# Patient Record
Sex: Male | Born: 1945 | Race: White | Hispanic: No | Marital: Single | Smoking: Former smoker
Health system: Southern US, Community
[De-identification: ages and names within clinical notes are randomized; demographics above are authoritative.]

## PROBLEM LIST (undated history)

## (undated) DIAGNOSIS — E7800 Pure hypercholesterolemia, unspecified: Secondary | ICD-10-CM

## (undated) DIAGNOSIS — I10 Essential (primary) hypertension: Secondary | ICD-10-CM

## (undated) DIAGNOSIS — F419 Anxiety disorder, unspecified: Secondary | ICD-10-CM

## (undated) HISTORY — PX: EYE SURGERY: SHX253

## (undated) HISTORY — PX: HERNIA REPAIR: SHX51

## (undated) HISTORY — PX: HAND SURGERY: SHX662

---

## 2015-05-19 ENCOUNTER — Other Ambulatory Visit: Payer: Self-pay | Admitting: Acute Care

## 2015-05-19 DIAGNOSIS — Z87891 Personal history of nicotine dependence: Secondary | ICD-10-CM

## 2015-05-23 ENCOUNTER — Ambulatory Visit
Admission: RE | Admit: 2015-05-23 | Discharge: 2015-05-23 | Disposition: A | Discharge status: Home / Self Care | Payer: BC Managed Care – PPO | Source: Ambulatory Visit | Attending: Acute Care | Admitting: Acute Care

## 2015-05-23 DIAGNOSIS — Z87891 Personal history of nicotine dependence: Secondary | ICD-10-CM

## 2015-09-24 ENCOUNTER — Other Ambulatory Visit: Payer: Self-pay | Admitting: Acute Care

## 2015-09-24 DIAGNOSIS — Z87891 Personal history of nicotine dependence: Secondary | ICD-10-CM

## 2016-05-11 ENCOUNTER — Other Ambulatory Visit: Payer: Self-pay | Admitting: Orthopedic Surgery

## 2016-05-11 ENCOUNTER — Telehealth: Payer: Self-pay | Admitting: Acute Care

## 2016-05-11 NOTE — Telephone Encounter (Signed)
I had called the patient to schedule the Lung Cancer CT he returned my call today and stated that since the CT scan was normal last 05/23/15 he couldn't afford to continue doing the CT's for the next 7 years. His insurance keeps changing and he just had to cancel a colonoscopy that would have been [phone]. I told him I would put a note in his chart to that effect.

## 2016-06-10 ENCOUNTER — Encounter (HOSPITAL_BASED_OUTPATIENT_CLINIC_OR_DEPARTMENT_OTHER): Payer: Self-pay | Admitting: *Deleted

## 2016-06-14 ENCOUNTER — Encounter (HOSPITAL_BASED_OUTPATIENT_CLINIC_OR_DEPARTMENT_OTHER)
Admission: RE | Admit: 2016-06-14 | Discharge: 2016-06-14 | Disposition: A | Discharge status: Home / Self Care | Payer: BC Managed Care – PPO | Source: Ambulatory Visit | Attending: Orthopedic Surgery | Admitting: Orthopedic Surgery

## 2016-06-14 DIAGNOSIS — I10 Essential (primary) hypertension: Secondary | ICD-10-CM | POA: Diagnosis not present

## 2016-06-14 DIAGNOSIS — Z8612 Personal history of poliomyelitis: Secondary | ICD-10-CM | POA: Diagnosis not present

## 2016-06-14 DIAGNOSIS — M720 Palmar fascial fibromatosis [Dupuytren]: Secondary | ICD-10-CM | POA: Diagnosis not present

## 2016-06-14 DIAGNOSIS — M20091 Other deformity of right finger(s): Secondary | ICD-10-CM | POA: Diagnosis present

## 2016-06-14 DIAGNOSIS — Z79899 Other long term (current) drug therapy: Secondary | ICD-10-CM | POA: Diagnosis not present

## 2016-06-14 DIAGNOSIS — F419 Anxiety disorder, unspecified: Secondary | ICD-10-CM | POA: Diagnosis not present

## 2016-06-14 DIAGNOSIS — E785 Hyperlipidemia, unspecified: Secondary | ICD-10-CM | POA: Diagnosis not present

## 2016-06-14 NOTE — Progress Notes (Signed)
Spoke with Dr Randa EvensEdwards concerning ekg  State ok will eval in am

## 2016-06-15 ENCOUNTER — Ambulatory Visit (HOSPITAL_BASED_OUTPATIENT_CLINIC_OR_DEPARTMENT_OTHER)
Admission: RE | Admit: 2016-06-15 | Discharge: 2016-06-15 | Disposition: A | Discharge status: Home / Self Care | Payer: BC Managed Care – PPO | Source: Ambulatory Visit | Attending: Orthopedic Surgery | Admitting: Orthopedic Surgery

## 2016-06-15 ENCOUNTER — Ambulatory Visit (HOSPITAL_BASED_OUTPATIENT_CLINIC_OR_DEPARTMENT_OTHER): Payer: BC Managed Care – PPO | Admitting: Anesthesiology

## 2016-06-15 ENCOUNTER — Encounter (HOSPITAL_BASED_OUTPATIENT_CLINIC_OR_DEPARTMENT_OTHER): Payer: Self-pay | Admitting: Anesthesiology

## 2016-06-15 ENCOUNTER — Encounter (HOSPITAL_BASED_OUTPATIENT_CLINIC_OR_DEPARTMENT_OTHER)
Admission: RE | Disposition: A | Discharge status: Home / Self Care | Payer: Self-pay | Source: Ambulatory Visit | Attending: Orthopedic Surgery

## 2016-06-15 DIAGNOSIS — F419 Anxiety disorder, unspecified: Secondary | ICD-10-CM | POA: Insufficient documentation

## 2016-06-15 DIAGNOSIS — M720 Palmar fascial fibromatosis [Dupuytren]: Secondary | ICD-10-CM | POA: Insufficient documentation

## 2016-06-15 DIAGNOSIS — Z79899 Other long term (current) drug therapy: Secondary | ICD-10-CM | POA: Insufficient documentation

## 2016-06-15 DIAGNOSIS — E785 Hyperlipidemia, unspecified: Secondary | ICD-10-CM | POA: Insufficient documentation

## 2016-06-15 DIAGNOSIS — I10 Essential (primary) hypertension: Secondary | ICD-10-CM | POA: Insufficient documentation

## 2016-06-15 DIAGNOSIS — Z8612 Personal history of poliomyelitis: Secondary | ICD-10-CM | POA: Insufficient documentation

## 2016-06-15 HISTORY — DX: Anxiety disorder, unspecified: F41.9

## 2016-06-15 HISTORY — PX: DUPUYTREN CONTRACTURE RELEASE: SHX1478

## 2016-06-15 HISTORY — PX: FASCIECTOMY: SHX6525

## 2016-06-15 HISTORY — DX: Essential (primary) hypertension: I10

## 2016-06-15 SURGERY — RELEASE, DUPUYTREN CONTRACTURE
Anesthesia: General | Site: Hand | Laterality: Right

## 2016-06-15 MED ORDER — HEPARIN SODIUM (PORCINE) 1000 UNIT/ML IJ SOLN
INTRAMUSCULAR | Status: DC | PRN
  Administered 2016-06-15: 1 mL via INTRAVENOUS

## 2016-06-15 MED ORDER — FENTANYL CITRATE (PF) 100 MCG/2ML IJ SOLN
25.0000 ug | INTRAMUSCULAR | Status: DC | PRN
  Administered 2016-06-15: 25 ug via INTRAVENOUS

## 2016-06-15 MED ORDER — PROPOFOL 10 MG/ML IV BOLUS
INTRAVENOUS | Status: DC | PRN
  Administered 2016-06-15: 40 mg via INTRAVENOUS
  Administered 2016-06-15: 160 mg via INTRAVENOUS

## 2016-06-15 MED ORDER — HEPARIN SODIUM (PORCINE) 1000 UNIT/ML IJ SOLN
INTRAMUSCULAR | Status: AC
  Filled 2016-06-15: qty 1

## 2016-06-15 MED ORDER — BUPIVACAINE-EPINEPHRINE (PF) 0.5% -1:200000 IJ SOLN
INTRAMUSCULAR | Status: DC | PRN
  Administered 2016-06-15: 30 mL via PERINEURAL

## 2016-06-15 MED ORDER — ONDANSETRON HCL 4 MG/2ML IJ SOLN: 4.0000 mg | Freq: Once | INTRAMUSCULAR | Status: DC | PRN

## 2016-06-15 MED ORDER — FENTANYL CITRATE (PF) 100 MCG/2ML IJ SOLN
50.0000 ug | INTRAMUSCULAR | Status: AC | PRN
  Administered 2016-06-15: 100 ug via INTRAVENOUS
  Administered 2016-06-15 (×2): 50 ug via INTRAVENOUS

## 2016-06-15 MED ORDER — PROPOFOL 10 MG/ML IV BOLUS
INTRAVENOUS | Status: AC
  Filled 2016-06-15: qty 20

## 2016-06-15 MED ORDER — MIDAZOLAM HCL 2 MG/2ML IJ SOLN
INTRAMUSCULAR | Status: AC
  Filled 2016-06-15: qty 2

## 2016-06-15 MED ORDER — SCOPOLAMINE 1 MG/3DAYS TD PT72: 1.0000 | MEDICATED_PATCH | Freq: Once | TRANSDERMAL | Status: DC | PRN

## 2016-06-15 MED ORDER — MIDAZOLAM HCL 2 MG/2ML IJ SOLN
1.0000 mg | INTRAMUSCULAR | Status: DC | PRN
  Administered 2016-06-15: 2 mg via INTRAVENOUS

## 2016-06-15 MED ORDER — LACTATED RINGERS IV SOLN
INTRAVENOUS | Status: DC
  Administered 2016-06-15 (×2): via INTRAVENOUS

## 2016-06-15 MED ORDER — PHENYLEPHRINE HCL 10 MG/ML IJ SOLN
INTRAMUSCULAR | Status: DC | PRN
  Administered 2016-06-15 (×4): 80 ug via INTRAVENOUS

## 2016-06-15 MED ORDER — FENTANYL CITRATE (PF) 100 MCG/2ML IJ SOLN
INTRAMUSCULAR | Status: AC
  Filled 2016-06-15: qty 2

## 2016-06-15 MED ORDER — THROMBIN 5000 UNITS EX SOLR
CUTANEOUS | Status: DC | PRN
  Administered 2016-06-15: 5000 [IU] via TOPICAL

## 2016-06-15 MED ORDER — CEFAZOLIN SODIUM-DEXTROSE 2-4 GM/100ML-% IV SOLN
2.0000 g | INTRAVENOUS | Status: AC
  Administered 2016-06-15: 2 g via INTRAVENOUS

## 2016-06-15 MED ORDER — HYDROCODONE-ACETAMINOPHEN 5-325 MG PO TABS: 1.0000 | ORAL_TABLET | Freq: Four times a day (QID) | ORAL | 0 refills | Status: DC | PRN

## 2016-06-15 MED ORDER — LIDOCAINE HCL (CARDIAC) 20 MG/ML IV SOLN
INTRAVENOUS | Status: DC | PRN
  Administered 2016-06-15: 60 mg via INTRAVENOUS

## 2016-06-15 MED ORDER — THROMBIN 5000 UNITS EX SOLR
CUTANEOUS | Status: AC
  Filled 2016-06-15: qty 5000

## 2016-06-15 MED ORDER — EPHEDRINE SULFATE 50 MG/ML IJ SOLN
INTRAMUSCULAR | Status: DC | PRN
  Administered 2016-06-15 (×4): 10 mg via INTRAVENOUS

## 2016-06-15 MED ORDER — CEFAZOLIN SODIUM-DEXTROSE 2-4 GM/100ML-% IV SOLN
INTRAVENOUS | Status: AC
  Filled 2016-06-15: qty 100

## 2016-06-15 MED ORDER — ONDANSETRON HCL 4 MG/2ML IJ SOLN
INTRAMUSCULAR | Status: DC | PRN
  Administered 2016-06-15: 4 mg via INTRAVENOUS

## 2016-06-15 MED ORDER — DEXAMETHASONE SODIUM PHOSPHATE 10 MG/ML IJ SOLN
INTRAMUSCULAR | Status: DC | PRN
  Administered 2016-06-15: 10 mg via INTRAVENOUS

## 2016-06-15 MED ORDER — CHLORHEXIDINE GLUCONATE 4 % EX LIQD: 60.0000 mL | Freq: Once | CUTANEOUS | Status: DC

## 2016-06-15 SURGICAL SUPPLY — 50 items
BLADE MINI RND TIP GREEN BEAV (BLADE) ×3 IMPLANT
BLADE SURG 15 STRL LF DISP TIS (BLADE) ×1 IMPLANT
BLADE SURG 15 STRL SS (BLADE) ×2
BNDG COHESIVE 3X5 TAN STRL LF (GAUZE/BANDAGES/DRESSINGS) ×3 IMPLANT
BNDG ESMARK 4X9 LF (GAUZE/BANDAGES/DRESSINGS) ×3 IMPLANT
BNDG GAUZE ELAST 4 BULKY (GAUZE/BANDAGES/DRESSINGS) ×6 IMPLANT
CHLORAPREP W/TINT 26ML (MISCELLANEOUS) ×3 IMPLANT
CORDS BIPOLAR (ELECTRODE) ×3 IMPLANT
COVER BACK TABLE 60X90IN (DRAPES) ×3 IMPLANT
COVER MAYO STAND STRL (DRAPES) ×3 IMPLANT
CUFF TOURNIQUET SINGLE 18IN (TOURNIQUET CUFF) ×3 IMPLANT
DECANTER SPIKE VIAL GLASS SM (MISCELLANEOUS) IMPLANT
DRAPE EXTREMITY T 121X128X90 (DRAPE) ×3 IMPLANT
DRAPE SURG 17X23 STRL (DRAPES) ×3 IMPLANT
GAUZE SPONGE 4X4 12PLY STRL (GAUZE/BANDAGES/DRESSINGS) ×6 IMPLANT
GAUZE XEROFORM 1X8 LF (GAUZE/BANDAGES/DRESSINGS) ×6 IMPLANT
GLOVE BIO SURGEON STRL SZ 6.5 (GLOVE) ×2 IMPLANT
GLOVE BIO SURGEONS STRL SZ 6.5 (GLOVE) ×1
GLOVE BIOGEL PI IND STRL 7.0 (GLOVE) ×2 IMPLANT
GLOVE BIOGEL PI IND STRL 8 (GLOVE) ×2 IMPLANT
GLOVE BIOGEL PI IND STRL 8.5 (GLOVE) ×1 IMPLANT
GLOVE BIOGEL PI INDICATOR 7.0 (GLOVE) ×4
GLOVE BIOGEL PI INDICATOR 8 (GLOVE) ×4
GLOVE BIOGEL PI INDICATOR 8.5 (GLOVE) ×2
GLOVE SURG ORTHO 8.0 STRL STRW (GLOVE) ×3 IMPLANT
GLOVE SURG SS PI 7.5 STRL IVOR (GLOVE) ×6 IMPLANT
GOWN STRL REUS W/ TWL LRG LVL3 (GOWN DISPOSABLE) ×3 IMPLANT
GOWN STRL REUS W/TWL LRG LVL3 (GOWN DISPOSABLE) ×6
GOWN STRL REUS W/TWL XL LVL3 (GOWN DISPOSABLE) ×3 IMPLANT
LOOP VESSEL MAXI BLUE (MISCELLANEOUS) ×3 IMPLANT
NEEDLE PRECISIONGLIDE 27X1.5 (NEEDLE) ×3 IMPLANT
NS IRRIG 1000ML POUR BTL (IV SOLUTION) ×3 IMPLANT
PACK BASIN DAY SURGERY FS (CUSTOM PROCEDURE TRAY) ×3 IMPLANT
PAD CAST 3X4 CTTN HI CHSV (CAST SUPPLIES) ×2 IMPLANT
PADDING CAST ABS 3INX4YD NS (CAST SUPPLIES)
PADDING CAST ABS 4INX4YD NS (CAST SUPPLIES)
PADDING CAST ABS COTTON 3X4 (CAST SUPPLIES) IMPLANT
PADDING CAST ABS COTTON 4X4 ST (CAST SUPPLIES) IMPLANT
PADDING CAST COTTON 3X4 STRL (CAST SUPPLIES) ×4
SLEEVE SCD COMPRESS KNEE MED (MISCELLANEOUS) ×3 IMPLANT
SLING ARM FOAM STRAP LRG (SOFTGOODS) ×3 IMPLANT
SPLINT PLASTER CAST XFAST 3X15 (CAST SUPPLIES) ×8 IMPLANT
SPLINT PLASTER XTRA FASTSET 3X (CAST SUPPLIES) ×16
STOCKINETTE 4X48 STRL (DRAPES) ×3 IMPLANT
SUT ETHILON 4 0 PS 2 18 (SUTURE) ×9 IMPLANT
SUT SILK 2 0 FS (SUTURE) ×3 IMPLANT
SYR BULB 3OZ (MISCELLANEOUS) ×3 IMPLANT
SYR CONTROL 10ML LL (SYRINGE) ×3 IMPLANT
TOWEL OR 17X24 6PK STRL BLUE (TOWEL DISPOSABLE) ×6 IMPLANT
UNDERPAD 30X30 (UNDERPADS AND DIAPERS) IMPLANT

## 2016-06-15 NOTE — Brief Op Note (Signed)
06/15/2016  1:07 PM  PATIENT:  Jason BrowGregory D Todd  70 y.o. male  PRE-OPERATIVE DIAGNOSIS:  dupuytren's contracture hand  POST-OPERATIVE DIAGNOSIS:  dupuytren's contracture right hand  PROCEDURE:  Procedure(s): Excision DUPUYTREN palmar fibromatosis (Right) FASCIECTOMY right small and ring finger (Right)  SURGEON:  Surgeon(s) and Role:    * Cindee SaltGary Molly Savarino, MD - Primary  PHYSICIAN ASSISTANT:   ASSISTANTS: none   ANESTHESIA:   regional and IV sedation  EBL:  Total I/O In: 1800 [I.V.:1800] Out: -   BLOOD ADMINISTERED:none  DRAINS: Penrose drain in the hand   LOCAL MEDICATIONS USED:  NONE  SPECIMEN:  Excision  DISPOSITION OF SPECIMEN:  PATHOLOGY  COUNTS:  YES  TOURNIQUET:   Total Tourniquet Time Documented: Upper Arm (Right) - 103 minutes Total: Upper Arm (Right) - 103 minutes   DICTATION: .Other Dictation: Dictation Number [id number]  PLAN OF CARE: Discharge to home after PACU  PATIENT DISPOSITION:  PACU - hemodynamically stable.

## 2016-06-15 NOTE — Transfer of Care (Signed)
Immediate Anesthesia Transfer of Care Note  Patient: Jason Todd  Procedure(s) Performed: Procedure(s): Excision DUPUYTREN palmar fibromatosis (Right) FASCIECTOMY right small and ring finger (Right)  Patient Location: PACU  Anesthesia Type:GA combined with regional for post-op pain  Level of Consciousness: awake, alert  and oriented  Airway & Oxygen Therapy: Patient Spontanous Breathing and Patient connected to face mask oxygen  Post-op Assessment: Report given to RN and Post -op Vital signs reviewed and stable  Post vital signs: Reviewed and stable  Last Vitals:  Vitals:   06/15/16 1309 06/15/16 1310  BP:  128/90  Pulse: (!) 109   Resp: 14 19  Temp:      Last Pain:  Vitals:   06/15/16 0920  TempSrc: Oral         Complications: No apparent anesthesia complications

## 2016-06-15 NOTE — Anesthesia Preprocedure Evaluation (Addendum)
Anesthesia Evaluation  Patient identified by MRN, date of birth, ID band Patient awake    Reviewed: Allergy & Precautions, NPO status , Patient's Chart, lab work & pertinent test results  Airway Mallampati: II  TM Distance: >3 FB Neck ROM: Full    Dental no notable dental hx. (+) Teeth Intact   Pulmonary neg pulmonary ROS,    Pulmonary exam normal breath sounds clear to auscultation       Cardiovascular hypertension, Pt. on medications Normal cardiovascular exam+ dysrhythmias  Rhythm:Regular Rate:Normal     Neuro/Psych Anxiety negative neurological ROS     GI/Hepatic negative GI ROS, Neg liver ROS,   Endo/Other  Hyperlipidemia  Renal/GU negative Renal ROS  negative genitourinary   Musculoskeletal negative musculoskeletal ROS (+)   Abdominal   Peds  Hematology negative hematology ROS (+)   Anesthesia Other Findings   Reproductive/Obstetrics                          EKG: sinus tachycardia, Incomplete RBBB pattern.  Anesthesia Physical Anesthesia Plan  ASA: II  Anesthesia Plan: General and Regional   Post-op Pain Management:  Regional for Post-op pain   Induction: Intravenous  Airway Management Planned: LMA  Additional Equipment:   Intra-op Plan:   Post-operative Plan: Extubation in OR  Informed Consent: I have reviewed the patients History and Physical, chart, labs and discussed the procedure including the risks, benefits and alternatives for the proposed anesthesia with the patient or authorized representative who has indicated his/her understanding and acceptance.   Dental advisory given  Plan Discussed with: Anesthesiologist, CRNA and Surgeon  Anesthesia Plan Comments:       Anesthesia Quick Evaluation

## 2016-06-15 NOTE — H&P (Signed)
  Jason BrowGregory D Todd is an 70 y.o. male.   Chief Complaint: contracture right ring and small fingers HPI: Jason GottronGregory Todd is a 70 year old right hand dominant male who was last seen in 2011. He has a history of Polio and history of Dupuytren's contracture. He has seen Dr. Bronson Ingichard Goldner who did an aponeurotomy of his right ring and small fingers in 2014 but this has recurred. He has a history of carpal tunnel syndrome on that side. He has a history of injury to his ring finger which he feels aggravated the Dupuytren's resulting in the aponeurotomy. The contractures have recurred. He has a history of Dupuytren's. He does not have Peyronies. He is otherwise in reasonably good health. He is not complaining of any pain or discomfort at the present time.      Past Medical History:  Diagnosis Date  . Anxiety   . Hypertension     Past Surgical History:  Procedure Laterality Date  . EYE SURGERY    . HAND SURGERY     x 2  . HERNIA REPAIR      History reviewed. No pertinent family history. Social History:  reports that he has never smoked. He has never used smokeless tobacco. He reports that he drinks alcohol. He reports that he does not use drugs.  Allergies:  Allergies  Allergen Reactions  . Lipitor [Atorvastatin] Other (See Comments)    Muscle ache  . Sulfa Antibiotics     unsure    Medications Prior to Admission  Medication Sig Dispense Refill  . cholecalciferol (VITAMIN D) 1000 units tablet Take 1,000 Units by mouth daily.    . clonazePAM (KLONOPIN) 0.5 MG tablet Take 0.5 mg by mouth 2 (two) times daily as needed for anxiety.    Marland Kitchen. lisinopril (PRINIVIL,ZESTRIL) 10 MG tablet Take 10 mg by mouth daily.    . Multiple Vitamin (MULTIVITAMIN WITH MINERALS) TABS tablet Take 1 tablet by mouth daily.    . simvastatin (ZOCOR) 20 MG tablet Take 20 mg by mouth daily.    . valACYclovir (VALTREX) 1000 MG tablet Take 1,000 mg by mouth 2 (two) times daily.      No results found for this or any  previous visit (from the past 48 hour(s)).  No results found.   Pertinent items are noted in HPI.  Height 5\' 1"  (1.549 m), weight 65.8 kg (145 lb).  General appearance: alert, cooperative and appears stated age Head: Normocephalic, without obvious abnormality Neck: no JVD Resp: clear to auscultation bilaterally Cardio: regular rate and rhythm, S1, S2 normal, no murmur, click, rub or gallop GI: soft, non-tender; bowel sounds normal; no masses,  no organomegaly Extremities: contracture right ringand small fingers Pulses: 2+ and symmetric Skin: Skin color, texture, turgor normal. No rashes or lesions Neurologic: Grossly normal Incision/Wound: na  Assessment/Plan  DIAGNOSIS: Dupuyten's contracture s/p aponeurotomy     Plan: We have discussed the etiology of the Dupuytren's with him. Various treatment alternatives including aponeurotomy Xiaflex fasciotomy fasciectomy along with risks and complications of each. Pre, peri and Postoperative course been discussed along with risks and complications. He is aware that there is no guarantee to the surgery the possibility of infection recurrence injury to arteries nerves tendons incomplete relief of symptoms and dystrophy. He would like to proceed he is scheduled for fasciectomy ring and small fingers right hand as an outpatient under regional anesthesia.      Samanatha Brammer R 06/15/2016, 9:04 AM

## 2016-06-15 NOTE — Op Note (Signed)
Dictation Number [id number]

## 2016-06-15 NOTE — Anesthesia Procedure Notes (Signed)
Anesthesia Regional Block:  Supraclavicular block  Pre-Anesthetic Checklist: ,, timeout performed, Correct Patient, Correct Site, Correct Laterality, Correct Procedure, Correct Position, site marked, Risks and benefits discussed,  Surgical consent,  Pre-op evaluation,  At surgeon's request and post-op pain management  Laterality: Right  Prep: chloraprep       Needles:  Injection technique: Single-shot  Needle Type: Stimulator Needle - 80          Additional Needles:  Procedures: ultrasound guided (picture in chart) Supraclavicular block Narrative:  Start time: 06/15/2016 10:22 AM End time: 06/15/2016 10:26 AM Injection made incrementally with aspirations every 5 mL.  Performed by: Personally  Anesthesiologist: Lannis Lichtenwalner  Additional Notes: 30 cc 0.5% Bupivacaine with 1:200 Epi injected easily

## 2016-06-15 NOTE — Progress Notes (Signed)
Assisted Dr. Joslin with right, ultrasound guided, supraclavicular block. Side rails up, monitors on throughout procedure. See vital signs in flow sheet. Tolerated Procedure well. 

## 2016-06-15 NOTE — Discharge Instructions (Addendum)
[facility] Instructions Hand Surgery  Wound Care: Keep your hand elevated above the level of your heart.  Do not allow it to dangle by your side.  Keep the dressing dry and do not remove it unless your doctor advises you to do so.  He will usually change it at the time of your post-op visit.  Moving your fingers is advised to stimulate circulation but will depend on the site of your surgery.  If you have a splint applied, your doctor will advise you regarding movement.  Activity: Do not drive or operate machinery today.  Rest today and then you may return to your normal activity and work as indicated by your physician.  Diet:  Drink liquids today or eat a light diet.  You may resume a regular diet tomorrow.    General expectations: Pain for two to three days. Fingers may become slightly swollen.  Call your doctor if any of the following occur: Severe pain not relieved by pain medication. Elevated temperature. Dressing soaked with blood. Inability to move fingers. White or bluish color to fingers.  One baby ASA a day   Post Anesthesia Home Care Instructions  Activity: Get plenty of rest for the remainder of the day. A responsible adult should stay with you for 24 hours following the procedure.  For the next 24 hours, DO NOT: -Drive a car -Advertising copywriterperate machinery -Drink alcoholic beverages -Take any medication unless instructed by your physician -Make any legal decisions or sign important papers.  Meals: Start with liquid foods such as gelatin or soup. Progress to regular foods as tolerated. Avoid greasy, spicy, heavy foods. If nausea and/or vomiting occur, drink only clear liquids until the nausea and/or vomiting subsides. Call your physician if vomiting continues.  Special Instructions/Symptoms: Your throat may feel dry or sore from the anesthesia or the breathing tube placed in your throat during surgery. If this causes discomfort, gargle with warm salt water. The discomfort  should disappear within 24 hours.  If you had a scopolamine patch placed behind your ear for the management of post- operative nausea and/or vomiting:  1. The medication in the patch is effective for 72 hours, after which it should be removed.  Wrap patch in a tissue and discard in the trash. Wash hands thoroughly with soap and water. 2. You may remove the patch earlier than 72 hours if you experience unpleasant side effects which may include dry mouth, dizziness or visual disturbances. 3. Avoid touching the patch. Wash your hands with soap and water after contact with the patch.

## 2016-06-15 NOTE — Anesthesia Procedure Notes (Signed)
Procedure Name: LMA Insertion Date/Time: 06/15/2016 10:30 AM Performed by: Burna CashONRAD, Keshana Klemz C Pre-anesthesia Checklist: Patient identified, Emergency Drugs available, Suction available and Patient being monitored Patient Re-evaluated:Patient Re-evaluated prior to inductionOxygen Delivery Method: Circle system utilized Preoxygenation: Pre-oxygenation with 100% oxygen Intubation Type: IV induction Ventilation: Mask ventilation without difficulty LMA: LMA inserted LMA Size: 4.0 Number of attempts: 1 Airway Equipment and Method: Bite block Placement Confirmation: positive ETCO2 Tube secured with: Tape Dental Injury: Teeth and Oropharynx as per pre-operative assessment

## 2016-06-15 NOTE — Anesthesia Postprocedure Evaluation (Signed)
Anesthesia Post Note  Patient: Jason Todd  Procedure(s) Performed: Procedure(s) (LRB): Excision DUPUYTREN palmar fibromatosis (Right) FASCIECTOMY right small and ring finger (Right)  Patient location during evaluation: PACU Anesthesia Type: General and Regional Level of consciousness: awake and alert and oriented Pain management: pain level controlled Vital Signs Assessment: post-procedure vital signs reviewed and stable Respiratory status: spontaneous breathing, nonlabored ventilation and respiratory function stable Cardiovascular status: blood pressure returned to baseline and stable Postop Assessment: no signs of nausea or vomiting Anesthetic complications: no    Last Vitals:  Vitals:   06/15/16 1345 06/15/16 1346  BP: (!) 146/128 (!) 140/103  Pulse: (!) 122 (!) 120  Resp: 18 19  Temp:      Last Pain:  Vitals:   06/15/16 1345  TempSrc:   PainSc: 4                  Hannibal Skalla A.

## 2016-06-16 ENCOUNTER — Encounter (HOSPITAL_BASED_OUTPATIENT_CLINIC_OR_DEPARTMENT_OTHER): Payer: Self-pay | Admitting: Orthopedic Surgery

## 2016-06-16 NOTE — Op Note (Signed)
NAMIsidor Holts:  Jason Todd, Jason Todd             ACCOUNT NO.:  [id number]  MEDICAL RECORD NO.:  [id number]  LOCATION:                                 FACILITY:  PHYSICIAN:  Cindee SaltGary Arletta Lumadue, M.D.            DATE OF BIRTH:  DATE OF PROCEDURE:  06/15/2016 DATE OF DISCHARGE:                              OPERATIVE REPORT   PREOPERATIVE DIAGNOSIS:  Dupuytren contracture, right ring, right small fingers.  POSTOPERATIVE DIAGNOSIS:  Dupuytren contracture, right ring, right small fingers.  OPERATION:  Fasciectomy right ring and right small fingers.  SURGEON:  Cindee SaltGary Neave Lenger, M.D.  ASSISTANT:  None.  ANESTHESIA:  Regional with sedation.  HISTORY:  The patient is a 70 year old male with a history of Dupuytren contracture to the right ring and small fingers.  These show significant deformities to both the MP and PIP joints of approximately 30-40 degrees at each joint.  This is relatively fixed in nature.  He has undergone an aponeurotomy in the past to each of these digits with recurrence.  He is desirous of proceeding to have excisions performed.  He is aware that there is no guarantee to the surgery, the possibility of infection; recurrence of injury to arteries, nerves, tendons; incomplete relief of symptoms; dystrophy; possibility of loss of the finger; the possibility of stiffness.  In the preoperative area, the patient was seen, the extremity marked by both patient and surgeon.  Antibiotic was given.  PROCEDURE IN DETAIL:  The patient was brought to the operating room after a regional anesthetic was carried out by the Anesthesia department in the preoperative area.  Further anesthetic was enjoined via LMA.  He was prepped using ChloraPrep in a supine position with the right arm free.  A 3-minute dry time was allowed and time-out taken, confirming the patient and procedure.  The limb was exsanguinated with an Esmarch bandage.  Tourniquet placed high on the arm was inflated to 250 mmHg.  A volar Bruner  incision was made for placement of V-Y advancement as necessary dressing the ring finger first.  The incision was carried from the proximal aspect of the cord at its junction with the flexor retinaculum.  This was carried down through subcutaneous tissue.  The cord was immediately identified.  There was significant scar present along the entire cord throughout the entire procedure on both fingers. The cord was identified proximally.  The neurovascular bundles were identified proximally.  With blunt and sharp dissection, the cord was followed distally taking care to protect the neurovascular bundles both radially and ulnarly to the ring and ulnarly to the small finger.  This was carried distally with a large attachment at the A2 pulley, this was released.  The dissection was carried distally.  There was a spiral band and spiral nerve present on the ulnar aspect.  The neurovascular bundle was carefully protected throughout the procedure.  A very thick lateral digital sheet was present on either side along with involvement of neurovascular bundles both radially and ulnarly.  The cords were followed distally with an attachment at the PIP joint and further attachment out to the distal interphalangeal joint.  Care was maintained protecting neurovascular bundles radially  and ulnarly.  The lateral digital sheath cords were markedly thickened and each removed.  These were transected distally taking care to protect both artery and nerve distally.  This allowed the finger to come fully straight at both the MP and the PIP joint.  The small finger was attended to next.  A volar Bruner incision was made.  The connection over to the palmar fascia cord which had been transected to the ring finger was identified beneath the skin.  This was delivered distally in the incision and followed distally.  A large abductor digiti quinti cord was present.  The neurovascular bundle ulnarly was wrapped around this and  then underneath it.  The digital nerve from the radial side was identified and protected.  There was a very small radial artery present.  The dissection was carried distally.  After releasing the abductor digiti quinti, the metacarpophalangeal joint came fully straight.  The cord was followed distally both radially and ulnarly as lateral digital sheath cords which were significantly thickened.  These were followed attachment of the PIP joint and then distally out to the distal margin of the middle phalanx.  Each cord was followed distally.  There was a spiral nature to the one on the radial side and the nerve and artery were protected.  The cord was entirely excised both radially and ulnarly.  Again, the PIP joint on the small finger came fully straight. No release of the flexor sheath was necessary.  The wounds were copiously irrigated with saline and bathed in thrombin.  These were converted to Wise for advancement of the skin.  The skin was then closed over a doubled over vessel loop as a drain at each digit.  The wounds were fully closed.  A compressive dressing was applied and the tourniquet deflated.  The small finger immediately pinked.  The ring finger did not immediately pinked.  1000 units of heparin were given. His blood pressure was low, this was brought up and with time the ring finger fully pinked including the flaps after having the entire dressing removed to visualize the entire finger.  A nonadherent gauze and compressive wrap were applied along with a dorsal splint.  The patient tolerated the procedure well and was taken to the recovery room for observation in satisfactory condition.  He will be discharged to home [facility] [place] [facility] [place] on [place].          ______________________________ Cindee SaltGary Talis Jason Todd, M.D.     GK/MEDQ  D:  06/15/2016  T:  06/16/2016  Job:  [id number]

## 2017-07-13 ENCOUNTER — Telehealth: Payer: Self-pay | Admitting: Acute Care

## 2017-07-13 DIAGNOSIS — Z122 Encounter for screening for malignant neoplasm of respiratory organs: Secondary | ICD-10-CM

## 2017-07-13 DIAGNOSIS — Z87891 Personal history of nicotine dependence: Secondary | ICD-10-CM

## 2017-07-14 NOTE — Telephone Encounter (Signed)
Spoke with pt.  He has spoken with Dr Docia ChuckKoirala and has decided to participate in lung screening again.  CT ordered through [facility]. Marland Kitchen.  [facility]

## 2017-07-28 ENCOUNTER — Telehealth: Payer: Self-pay | Admitting: Acute Care

## 2017-07-28 NOTE — Telephone Encounter (Signed)
LMTC x 1 - Need to reschedule SDMV to possibly 08/08/17 2:00 with [facility]

## 2017-08-01 ENCOUNTER — Telehealth: Payer: Self-pay | Admitting: Acute Care

## 2017-08-01 ENCOUNTER — Encounter: Payer: BC Managed Care – PPO | Admitting: Acute Care

## 2017-08-01 NOTE — Telephone Encounter (Signed)
Spoke with pt and rescheduled SDMV ot 08/08/17 2:00.  Ct will be rescheduled. Nothing further needed

## 2017-08-02 ENCOUNTER — Ambulatory Visit: Payer: BC Managed Care – PPO

## 2017-08-02 NOTE — Telephone Encounter (Signed)
Error, close encounter

## 2017-08-08 ENCOUNTER — Ambulatory Visit
Admission: RE | Admit: 2017-08-08 | Discharge: 2017-08-08 | Disposition: A | Discharge status: Home / Self Care | Payer: BC Managed Care – PPO | Source: Ambulatory Visit | Attending: Acute Care | Admitting: Acute Care

## 2017-08-08 ENCOUNTER — Ambulatory Visit: Payer: BC Managed Care – PPO | Admitting: Acute Care

## 2017-08-08 ENCOUNTER — Encounter: Payer: Self-pay | Admitting: Acute Care

## 2017-08-08 DIAGNOSIS — Z87891 Personal history of nicotine dependence: Secondary | ICD-10-CM

## 2017-08-08 DIAGNOSIS — Z122 Encounter for screening for malignant neoplasm of respiratory organs: Secondary | ICD-10-CM

## 2017-08-08 NOTE — Progress Notes (Signed)
Shared Decision Making Visit Lung Cancer Screening Program [phone]([id number])   Eligibility:  Age 72 y.o.  Pack Years Smoking History Calculation 40 pack year smoking history (# packs/per year x # years smoked)  Recent History of coughing up blood  no  Unexplained weight loss? no ( >Than 15 pounds within the last 6 months )  Prior History Lung / other cancer no (Diagnosis within the last 5 years already requiring surveillance chest CT Scans).  Smoking Status Former Smoker  Former Smokers: Years since quit: 9 years  Quit Date: 03/2008  Visit Components:  Discussion included one or more decision making aids. yes  Discussion included risk/benefits of screening. yes  Discussion included potential follow up diagnostic testing for abnormal scans. yes  Discussion included meaning and risk of over diagnosis. yes  Discussion included meaning and risk of False Positives. yes  Discussion included meaning of total radiation exposure. yes  Counseling Included:  Importance of adherence to annual lung cancer LDCT screening. yes  Impact of comorbidities on ability to participate in the program. yes  Ability and willingness to under diagnostic treatment. yes  Smoking Cessation Counseling:  Current Smokers:   Discussed importance of smoking cessation. NA former smoker  Information about tobacco cessation classes and interventions provided to patient. Yes  Patient provided with "ticket" for LDCT Scan. yes  Symptomatic Patient. no  Counseling  Diagnosis Code: Tobacco Use Z72.0  Asymptomatic Patient yes  Counseling (Intermediate counseling: > three minutes counseling) [id number]  Former Smokers:   Discussed the importance of maintaining cigarette abstinence. yes  Diagnosis Code: Personal History of Nicotine Dependence. U98.119Z87.891  Information about tobacco cessation classes and interventions provided to patient. Yes  Patient provided with "ticket" for LDCT Scan. yes  Written Order  for Lung Cancer Screening with LDCT placed in Epic. Yes (CT Chest Lung Cancer Screening Low Dose W/O CM) [id number] Z12.2-Screening of respiratory organs Z87.891-Personal history of nicotine dependence  I spent 25 minutes of face to face time with Dr.m Noralyn Pickarroll discussing the risks and benefits of lung cancer screening. We viewed a power point together that explained in detail the above noted topics. We took the time to pause the power point at intervals to allow for questions to be asked and answered to ensure understanding. We discussed that he had taken the single most powerful action possible to decrease his risk of developing lung cancer when he quit smoking. I counseled him to remain smoke free, and to contact me if he ever had the desire to smoke again so that I can provide resources and tools to help support the effort to remain smoke free. We discussed the time and location of the scan, and that either  Abigail Miyamotoenise Phelps RN or I will call with the results within  24-48 hours of receiving them. Dr. Noralyn Pickarroll has my card and contact information in the event he needs to speak with me, in addition to a copy of the power point we reviewed as a resource. He  verbalized understanding of all of the above and had no further questions upon leaving the office.     I explained to the patient that there has been a high incidence of coronary artery disease noted on these exams. I explained that this is a non-gated exam therefore degree or severity cannot be determined. This patient is currently on statin therapy. I have asked the patient to follow-up with their PCP regarding any incidental finding of coronary artery disease and management with diet or medication as  they feel is clinically indicated. The patient verbalized understanding of the above and had no further questions.     Bevelyn Ngo, NP 08/08/2017

## 2017-08-15 ENCOUNTER — Other Ambulatory Visit: Payer: Self-pay | Admitting: Acute Care

## 2017-08-15 DIAGNOSIS — Z122 Encounter for screening for malignant neoplasm of respiratory organs: Secondary | ICD-10-CM

## 2017-08-15 DIAGNOSIS — Z87891 Personal history of nicotine dependence: Secondary | ICD-10-CM

## 2018-08-09 ENCOUNTER — Ambulatory Visit
Admission: RE | Admit: 2018-08-09 | Discharge: 2018-08-09 | Disposition: A | Discharge status: Home / Self Care | Payer: BC Managed Care – PPO | Source: Ambulatory Visit | Attending: Acute Care | Admitting: Acute Care

## 2018-08-09 DIAGNOSIS — Z87891 Personal history of nicotine dependence: Secondary | ICD-10-CM

## 2018-08-09 DIAGNOSIS — Z122 Encounter for screening for malignant neoplasm of respiratory organs: Secondary | ICD-10-CM

## 2018-08-15 ENCOUNTER — Other Ambulatory Visit: Payer: Self-pay | Admitting: Acute Care

## 2018-08-15 DIAGNOSIS — Z87891 Personal history of nicotine dependence: Secondary | ICD-10-CM

## 2018-08-15 DIAGNOSIS — Z122 Encounter for screening for malignant neoplasm of respiratory organs: Secondary | ICD-10-CM

## 2019-08-13 ENCOUNTER — Telehealth: Payer: Self-pay | Admitting: Acute Care

## 2019-08-13 NOTE — Telephone Encounter (Signed)
I have called and had to leave a message to have Jason Todd to call me back in the morning

## 2019-08-14 NOTE — Telephone Encounter (Signed)
Jason Todd didn't realize that I did have the new [employer] information for the patient. I told her I had called and Prior Auth was not required Ref # [id number]

## 2019-08-17 ENCOUNTER — Ambulatory Visit
Admission: RE | Admit: 2019-08-17 | Discharge: 2019-08-17 | Disposition: A | Discharge status: Home / Self Care | Payer: BC Managed Care – PPO | Source: Ambulatory Visit | Attending: Acute Care | Admitting: Acute Care

## 2019-08-17 DIAGNOSIS — Z87891 Personal history of nicotine dependence: Secondary | ICD-10-CM

## 2019-08-17 DIAGNOSIS — Z122 Encounter for screening for malignant neoplasm of respiratory organs: Secondary | ICD-10-CM

## 2019-08-22 NOTE — Progress Notes (Signed)
Please call patient and let them  know their  low dose Ct was read as a Lung RADS 2: nodules that are benign in appearance and behavior with a very low likelihood of becoming a clinically active cancer due to size or lack of growth. Recommendation per radiology is for a repeat LDCT in 12 months. Please let them  know we will order and schedule their  annual screening scan for 08/2020. Please let them  know there was notation of CAD on their  scan.  Please remind the patient  that this is a non-gated exam therefore degree or severity of disease  cannot be determined. Please have them  follow up with their PCP regarding potential risk factor modification, dietary therapy or pharmacologic therapy if clinically indicated. Pt.  is  currently on statin therapy. Please place order for annual  screening scan for  08/2020 and fax results to PCP. Thanks so much. 

## 2019-08-30 ENCOUNTER — Other Ambulatory Visit: Payer: Self-pay | Admitting: *Deleted

## 2019-08-30 DIAGNOSIS — Z87891 Personal history of nicotine dependence: Secondary | ICD-10-CM

## 2019-08-31 ENCOUNTER — Ambulatory Visit: Payer: Medicare PPO | Attending: Internal Medicine

## 2019-08-31 DIAGNOSIS — Z23 Encounter for immunization: Secondary | ICD-10-CM | POA: Insufficient documentation

## 2019-08-31 NOTE — Progress Notes (Signed)
   Covid-19 Vaccination Clinic  Name:  Jason Todd    MRN: [id number] DOB: 03-17-1946  08/31/2019  Jason Todd was observed post Covid-19 immunization for 15 minutes without incidence. He was provided with Vaccine Information Sheet and instruction to access the V-Safe system.   Jason Todd was instructed to call 911 with any severe reactions post vaccine: Marland Kitchen Difficulty breathing  . Swelling of your face and throat  . A fast heartbeat  . A bad rash all over your body  . Dizziness and weakness    Immunizations Administered    Name Date Dose VIS Date Route   Pfizer COVID-19 Vaccine 08/31/2019  2:53 PM 0.3 mL 06/15/2019 Intramuscular   Manufacturer: [employer], [employer]   Lot: [id number]   NDC: [address]-9

## 2019-09-26 ENCOUNTER — Ambulatory Visit: Payer: Medicare PPO | Attending: Internal Medicine

## 2019-09-26 DIAGNOSIS — Z23 Encounter for immunization: Secondary | ICD-10-CM

## 2019-09-26 NOTE — Progress Notes (Signed)
   Covid-19 Vaccination Clinic  Name:  Jason Todd    MRN: [id number] DOB: 06-02-1946  09/26/2019  Mr. [name] was observed post Covid-19 immunization for 15 minutes without incident. He was provided with Vaccine Information Sheet and instruction to access the V-Safe system.   Mr. [name] was instructed to call 911 with any severe reactions post vaccine: Marland Kitchen Difficulty breathing  . Swelling of face and throat  . A fast heartbeat  . A bad rash all over body  . Dizziness and weakness   Immunizations Administered    Name Date Dose VIS Date Route   Pfizer COVID-19 Vaccine 09/26/2019  2:40 PM 0.3 mL 06/15/2019 Intramuscular   Manufacturer: [employer], [employer]   Lot: [id number]   NDC: [address]-4

## 2020-05-01 ENCOUNTER — Other Ambulatory Visit: Payer: Self-pay | Admitting: Orthopedic Surgery

## 2020-05-28 ENCOUNTER — Other Ambulatory Visit: Payer: Self-pay

## 2020-05-28 ENCOUNTER — Encounter (HOSPITAL_BASED_OUTPATIENT_CLINIC_OR_DEPARTMENT_OTHER): Payer: Self-pay | Admitting: Orthopedic Surgery

## 2020-06-06 ENCOUNTER — Encounter (HOSPITAL_BASED_OUTPATIENT_CLINIC_OR_DEPARTMENT_OTHER)
Admission: RE | Admit: 2020-06-06 | Discharge: 2020-06-06 | Disposition: A | Discharge status: Home / Self Care | Payer: Medicare PPO | Source: Ambulatory Visit | Attending: Orthopedic Surgery | Admitting: Orthopedic Surgery

## 2020-06-06 ENCOUNTER — Other Ambulatory Visit (HOSPITAL_COMMUNITY)
Admission: RE | Admit: 2020-06-06 | Discharge: 2020-06-06 | Disposition: A | Discharge status: Home / Self Care | Payer: Medicare PPO | Source: Ambulatory Visit | Attending: Orthopedic Surgery | Admitting: Orthopedic Surgery

## 2020-06-06 DIAGNOSIS — Z20822 Contact with and (suspected) exposure to covid-19: Secondary | ICD-10-CM | POA: Insufficient documentation

## 2020-06-06 DIAGNOSIS — Z01812 Encounter for preprocedural laboratory examination: Secondary | ICD-10-CM | POA: Insufficient documentation

## 2020-06-07 LAB — SARS CORONAVIRUS 2 (TAT 6-24 HRS): SARS Coronavirus 2: NEGATIVE

## 2020-06-10 ENCOUNTER — Encounter (HOSPITAL_BASED_OUTPATIENT_CLINIC_OR_DEPARTMENT_OTHER)
Admission: RE | Disposition: A | Discharge status: Home / Self Care | Payer: Self-pay | Source: Home / Self Care | Attending: Orthopedic Surgery

## 2020-06-10 ENCOUNTER — Ambulatory Visit (HOSPITAL_BASED_OUTPATIENT_CLINIC_OR_DEPARTMENT_OTHER)
Admission: RE | Admit: 2020-06-10 | Discharge: 2020-06-10 | Disposition: A | Discharge status: Home / Self Care | Payer: Medicare PPO | Attending: Orthopedic Surgery | Admitting: Orthopedic Surgery

## 2020-06-10 ENCOUNTER — Ambulatory Visit (HOSPITAL_BASED_OUTPATIENT_CLINIC_OR_DEPARTMENT_OTHER): Payer: Medicare PPO | Admitting: Anesthesiology

## 2020-06-10 ENCOUNTER — Emergency Department (HOSPITAL_COMMUNITY)
Admission: EM | Admit: 2020-06-10 | Discharge: 2020-06-10 | Disposition: A | Discharge status: Home / Self Care | Payer: Medicare PPO

## 2020-06-10 ENCOUNTER — Encounter (HOSPITAL_BASED_OUTPATIENT_CLINIC_OR_DEPARTMENT_OTHER): Payer: Self-pay | Admitting: Orthopedic Surgery

## 2020-06-10 ENCOUNTER — Other Ambulatory Visit: Payer: Self-pay

## 2020-06-10 DIAGNOSIS — M720 Palmar fascial fibromatosis [Dupuytren]: Secondary | ICD-10-CM | POA: Diagnosis present

## 2020-06-10 DIAGNOSIS — Z87891 Personal history of nicotine dependence: Secondary | ICD-10-CM | POA: Diagnosis not present

## 2020-06-10 DIAGNOSIS — Z882 Allergy status to sulfonamides status: Secondary | ICD-10-CM | POA: Diagnosis not present

## 2020-06-10 DIAGNOSIS — Z888 Allergy status to other drugs, medicaments and biological substances status: Secondary | ICD-10-CM | POA: Diagnosis not present

## 2020-06-10 HISTORY — PX: FASCIECTOMY: SHX6525

## 2020-06-10 HISTORY — DX: Pure hypercholesterolemia, unspecified: E78.00

## 2020-06-10 SURGERY — FASCIECTOMY, PALM
Anesthesia: Monitor Anesthesia Care | Site: Hand | Laterality: Left

## 2020-06-10 MED ORDER — CEFAZOLIN SODIUM-DEXTROSE 2-4 GM/100ML-% IV SOLN
INTRAVENOUS | Status: AC
  Filled 2020-06-10: qty 100

## 2020-06-10 MED ORDER — PHENYLEPHRINE HCL (PRESSORS) 10 MG/ML IV SOLN
INTRAVENOUS | Status: DC | PRN
  Administered 2020-06-10: 80 ug via INTRAVENOUS
  Administered 2020-06-10: 40 ug via INTRAVENOUS
  Administered 2020-06-10 (×2): 80 ug via INTRAVENOUS

## 2020-06-10 MED ORDER — CEFAZOLIN SODIUM-DEXTROSE 2-4 GM/100ML-% IV SOLN
2.0000 g | INTRAVENOUS | Status: AC
  Administered 2020-06-10: 2 g via INTRAVENOUS

## 2020-06-10 MED ORDER — MIDAZOLAM HCL 2 MG/2ML IJ SOLN
INTRAMUSCULAR | Status: AC
  Filled 2020-06-10: qty 2

## 2020-06-10 MED ORDER — ROPIVACAINE HCL 5 MG/ML IJ SOLN
INTRAMUSCULAR | Status: DC | PRN
  Administered 2020-06-10: 30 mL via PERINEURAL

## 2020-06-10 MED ORDER — HYDROCODONE-ACETAMINOPHEN 5-325 MG PO TABS: 1.0000 | ORAL_TABLET | Freq: Four times a day (QID) | ORAL | 0 refills | Status: AC | PRN

## 2020-06-10 MED ORDER — PROPOFOL 10 MG/ML IV BOLUS
INTRAVENOUS | Status: DC | PRN
  Administered 2020-06-10: 20 mg via INTRAVENOUS
  Administered 2020-06-10: 30 mg via INTRAVENOUS
  Administered 2020-06-10: 20 mg via INTRAVENOUS

## 2020-06-10 MED ORDER — FENTANYL CITRATE (PF) 100 MCG/2ML IJ SOLN
100.0000 ug | Freq: Once | INTRAMUSCULAR | Status: AC
  Administered 2020-06-10: 50 ug via INTRAVENOUS

## 2020-06-10 MED ORDER — THROMBIN 5000 UNITS EX SOLR
CUTANEOUS | Status: DC | PRN
  Administered 2020-06-10: 5000 [IU] via TOPICAL

## 2020-06-10 MED ORDER — PAPAVERINE HCL 30 MG/ML IJ SOLN
INTRAMUSCULAR | Status: DC | PRN
  Administered 2020-06-10: 60 mg via INTRAVENOUS

## 2020-06-10 MED ORDER — FENTANYL CITRATE (PF) 100 MCG/2ML IJ SOLN
INTRAMUSCULAR | Status: AC
  Filled 2020-06-10: qty 2

## 2020-06-10 MED ORDER — EPHEDRINE SULFATE 50 MG/ML IJ SOLN
INTRAMUSCULAR | Status: DC | PRN
  Administered 2020-06-10: 10 mg via INTRAVENOUS

## 2020-06-10 MED ORDER — PROPOFOL 500 MG/50ML IV EMUL
INTRAVENOUS | Status: DC | PRN
  Administered 2020-06-10: 100 ug/kg/min via INTRAVENOUS

## 2020-06-10 MED ORDER — MIDAZOLAM HCL 2 MG/2ML IJ SOLN
2.0000 mg | Freq: Once | INTRAMUSCULAR | Status: AC
  Administered 2020-06-10: 2 mg via INTRAVENOUS

## 2020-06-10 MED ORDER — LACTATED RINGERS IV SOLN: INTRAVENOUS | Status: DC

## 2020-06-10 SURGICAL SUPPLY — 47 items
APL PRP STRL LF DISP 70% ISPRP (MISCELLANEOUS) ×1
BLADE MINI RND TIP GREEN BEAV (BLADE) ×3 IMPLANT
BLADE SURG 15 STRL LF DISP TIS (BLADE) ×1 IMPLANT
BLADE SURG 15 STRL SS (BLADE) ×3
BNDG CMPR 9X4 STRL LF SNTH (GAUZE/BANDAGES/DRESSINGS) ×1
BNDG COHESIVE 3X5 TAN STRL LF (GAUZE/BANDAGES/DRESSINGS) ×3 IMPLANT
BNDG ESMARK 4X9 LF (GAUZE/BANDAGES/DRESSINGS) ×3 IMPLANT
BNDG GAUZE ELAST 4 BULKY (GAUZE/BANDAGES/DRESSINGS) ×3 IMPLANT
CHLORAPREP W/TINT 26 (MISCELLANEOUS) ×3 IMPLANT
CORD BIPOLAR FORCEPS 12FT (ELECTRODE) ×3 IMPLANT
COVER BACK TABLE 60X90IN (DRAPES) ×3 IMPLANT
COVER MAYO STAND STRL (DRAPES) ×3 IMPLANT
COVER WAND RF STERILE (DRAPES) IMPLANT
CUFF TOURN SGL QUICK 18X4 (TOURNIQUET CUFF) ×3 IMPLANT
DECANTER SPIKE VIAL GLASS SM (MISCELLANEOUS) IMPLANT
DRAPE EXTREMITY T 121X128X90 (DISPOSABLE) ×3 IMPLANT
DRAPE SURG 17X23 STRL (DRAPES) ×3 IMPLANT
GAUZE SPONGE 4X4 12PLY STRL (GAUZE/BANDAGES/DRESSINGS) ×3 IMPLANT
GAUZE XEROFORM 1X8 LF (GAUZE/BANDAGES/DRESSINGS) ×3 IMPLANT
GLOVE BIOGEL M STRL SZ7.5 (GLOVE) ×3 IMPLANT
GLOVE BIOGEL PI IND STRL 8.5 (GLOVE) ×1 IMPLANT
GLOVE BIOGEL PI INDICATOR 8.5 (GLOVE) ×2
GLOVE SURG ORTHO 8.0 STRL STRW (GLOVE) ×3 IMPLANT
GLOVE SURG SS PI 7.5 STRL IVOR (GLOVE) ×3 IMPLANT
GLOVE SURG SYN 7.5  E (GLOVE) ×2
GLOVE SURG SYN 7.5 E (GLOVE) ×1 IMPLANT
GOWN STRL REUS W/ TWL LRG LVL3 (GOWN DISPOSABLE) IMPLANT
GOWN STRL REUS W/ TWL XL LVL3 (GOWN DISPOSABLE) ×1 IMPLANT
GOWN STRL REUS W/TWL LRG LVL3 (GOWN DISPOSABLE)
GOWN STRL REUS W/TWL XL LVL3 (GOWN DISPOSABLE) ×9 IMPLANT
LOOP VESSEL MAXI BLUE (MISCELLANEOUS) ×3 IMPLANT
NEEDLE PRECISIONGLIDE 27X1.5 (NEEDLE) IMPLANT
NS IRRIG 1000ML POUR BTL (IV SOLUTION) ×3 IMPLANT
PACK BASIN DAY SURGERY FS (CUSTOM PROCEDURE TRAY) ×3 IMPLANT
PAD CAST 3X4 CTTN HI CHSV (CAST SUPPLIES) ×1 IMPLANT
PADDING CAST COTTON 3X4 STRL (CAST SUPPLIES) ×3
SLEEVE SCD COMPRESS KNEE MED (MISCELLANEOUS) ×3 IMPLANT
SLING ARM FOAM STRAP LRG (SOFTGOODS) ×3 IMPLANT
SPLINT PLASTER CAST XFAST 3X15 (CAST SUPPLIES) ×10 IMPLANT
SPLINT PLASTER XTRA FASTSET 3X (CAST SUPPLIES) ×20
STOCKINETTE 4X48 STRL (DRAPES) ×3 IMPLANT
SUT ETHILON 4 0 PS 2 18 (SUTURE) ×9 IMPLANT
SUT SILK 2 0 PERMA HAND 18 BK (SUTURE) ×3 IMPLANT
SYR BULB EAR ULCER 3OZ GRN STR (SYRINGE) ×3 IMPLANT
SYR CONTROL 10ML LL (SYRINGE) IMPLANT
TOWEL GREEN STERILE FF (TOWEL DISPOSABLE) ×6 IMPLANT
UNDERPAD 30X36 HEAVY ABSORB (UNDERPADS AND DIAPERS) ×3 IMPLANT

## 2020-06-10 NOTE — Anesthesia Procedure Notes (Signed)
Anesthesia Regional Block: Supraclavicular block   Pre-Anesthetic Checklist: ,, timeout performed, Correct Patient, Correct Site, Correct Laterality, Correct Procedure, Correct Position, site marked, Risks and benefits discussed,  Surgical consent,  Pre-op evaluation,  At surgeon's request and post-op pain management  Laterality: Left  Prep: chloraprep       Needles:  Injection technique: Single-shot  Needle Type: Echogenic Stimulator Needle     Needle Length: 10cm  Needle Gauge: 20     Additional Needles:   Procedures:,,,, ultrasound used (permanent image in chart),,,,  Narrative:  Start time: 06/10/2020 10:15 AM End time: 06/10/2020 10:21 AM Injection made incrementally with aspirations every 5 mL.  Performed by: Personally  Anesthesiologist: Lucretia Kern, MD  Additional Notes: Standard monitors applied. Skin prepped. Good needle visualization with ultrasound. Injection made in 5cc increments with no resistance to injection. Patient tolerated the procedure well.

## 2020-06-10 NOTE — Transfer of Care (Signed)
Immediate Anesthesia Transfer of Care Note  Patient: Jason Todd  Procedure(s) Performed: FASCIECTOMY LEFT MIDDLE AND RING FINGERS (Left Hand)  Patient Location: PACU  Anesthesia Type:MAC and Regional  Level of Consciousness: awake, alert  and oriented  Airway & Oxygen Therapy: Patient Spontanous Breathing  Post-op Assessment: Report given to RN and Post -op Vital signs reviewed and stable  Post vital signs: Reviewed and stable  Last Vitals:  Vitals Value Taken Time  BP 144/92 06/10/20 1152  Temp    Pulse 93 06/10/20 1153  Resp 26 06/10/20 1153  SpO2 99 % 06/10/20 1153  Vitals shown include unvalidated device data.  Last Pain:  Vitals:   06/10/20 0936  TempSrc: Oral  PainSc: 0-No pain         Complications: No complications documented.

## 2020-06-10 NOTE — Anesthesia Preprocedure Evaluation (Addendum)
Anesthesia Evaluation  Patient identified by MRN, date of birth, ID band Patient awake    Reviewed: Allergy & Precautions, NPO status , Patient's Chart, lab work & pertinent test results  History of Anesthesia Complications Negative for: history of anesthetic complications  Airway Mallampati: II  TM Distance: >3 FB Neck ROM: Full    Dental   Pulmonary neg pulmonary ROS, former smoker,    Pulmonary exam normal        Cardiovascular hypertension, Pt. on medications Normal cardiovascular exam     Neuro/Psych Anxiety negative neurological ROS     GI/Hepatic negative GI ROS, Neg liver ROS,   Endo/Other  negative endocrine ROS  Renal/GU negative Renal ROS  negative genitourinary   Musculoskeletal negative musculoskeletal ROS (+)   Abdominal   Peds  Hematology negative hematology ROS (+)   Anesthesia Other Findings   Reproductive/Obstetrics                            Anesthesia Physical Anesthesia Plan  ASA: II  Anesthesia Plan: MAC and Regional   Post-op Pain Management:    Induction: Intravenous  PONV Risk Score and Plan: 1 and Propofol infusion, TIVA and Treatment may vary due to age or medical condition  Airway Management Planned: Natural Airway, Nasal Cannula and Simple Face Mask  Additional Equipment: None  Intra-op Plan:   Post-operative Plan:   Informed Consent: I have reviewed the patients History and Physical, chart, labs and discussed the procedure including the risks, benefits and alternatives for the proposed anesthesia with the patient or authorized representative who has indicated his/her understanding and acceptance.       Plan Discussed with:   Anesthesia Plan Comments:        Anesthesia Quick Evaluation

## 2020-06-10 NOTE — Discharge Instructions (Addendum)
  Post Anesthesia Home Care Instructions  Activity: Get plenty of rest for the remainder of the day. A responsible individual must stay with you for 24 hours following the procedure.  For the next 24 hours, DO NOT: -Drive a car -Operate machinery -Drink alcoholic beverages -Take any medication unless instructed by your physician -Make any legal decisions or sign important papers.  Meals: Start with liquid foods such as gelatin or soup. Progress to regular foods as tolerated. Avoid greasy, spicy, heavy foods. If nausea and/or vomiting occur, drink only clear liquids until the nausea and/or vomiting subsides. Call your physician if vomiting continues.  Special Instructions/Symptoms: Your throat may feel dry or sore from the anesthesia or the breathing tube placed in your throat during surgery. If this causes discomfort, gargle with warm salt water. The discomfort should disappear within 24 hours.  Regional Anesthesia Blocks  1. Numbness or the inability to move the "blocked" extremity may last from 3-48 hours after placement. The length of time depends on the medication injected and your individual response to the medication. If the numbness is not going away after 48 hours, call your surgeon.  2. The extremity that is blocked will need to be protected until the numbness is gone and the  Strength has returned. Because you cannot feel it, you will need to take extra care to avoid injury. Because it may be weak, you may have difficulty moving it or using it. You may not know what position it is in without looking at it while the block is in effect.  3. For blocks in the legs and feet, returning to weight bearing and walking needs to be done carefully. You will need to wait until the numbness is entirely gone and the strength has returned. You should be able to move your leg and foot normally before you try and bear weight or walk. You will need someone to be with you when you first try to ensure  you do not fall and possibly risk injury.  4. Bruising and tenderness at the needle site are common side effects and will resolve in a few days.  5. Persistent numbness or new problems with movement should be communicated to the surgeon or the [facility] ([phone])/ [facility] ([phone]). Hand Center Instructions Hand Surgery  Wound Care: Keep your hand elevated above the level of your heart.  Do not allow it to dangle by your side.  Keep the dressing dry and do not remove it unless your doctor advises you to do so.  He will usually change it at the time of your post-op visit.  Moving your fingers is advised to stimulate circulation but will depend on the site of your surgery.  If you have a splint applied, your doctor will advise you regarding movement.  Activity: Do not drive or operate machinery today.  Rest today and then you may return to your normal activity and work as indicated by your physician.  Diet:  Drink liquids today or eat a light diet.  You may resume a regular diet tomorrow.    General expectations: Pain for two to three days. Fingers may become slightly swollen.  Call your doctor if any of the following occur: Severe pain not relieved by pain medication. Elevated temperature. Dressing soaked with blood. Inability to move fingers. White or bluish color to fingers.  

## 2020-06-10 NOTE — H&P (Signed)
  Jason Todd is an 74 y.o. male.   Chief Complaint:contracture left middle and ring fingers MPN:Jason Todd is a 75 yo male with Dupuytren's contracture left hand. He has a cord to the ring finger with extension to the ulnar aspect of his middle finger with primary metacarpal phalangeal joint flexion. He was last seen on 10/01/2019. He has had a fasciectomy on his opposite hand. He continues to complain of the webspace between middle and ring and the contracture to the middle and ring fingers. He has slight deformities to the PIP joint primarily the middle finger. Is not complain of any pain or discomfort no numbness or tingling   Past Medical History:  Diagnosis Date  . Anxiety   . Hypertension     Past Surgical History:  Procedure Laterality Date  . DUPUYTREN CONTRACTURE RELEASE Right 06/15/2016   Procedure: Excision DUPUYTREN palmar fibromatosis;  Surgeon: Cindee Salt, MD;  Location: [facility];  Service: Orthopedics;  Laterality: Right;  . EYE SURGERY    . FASCIECTOMY Right 06/15/2016   Procedure: FASCIECTOMY right small and ring finger;  Surgeon: Cindee Salt, MD;  Location: [facility];  Service: Orthopedics;  Laterality: Right;  . HAND SURGERY     x 2  . HERNIA REPAIR      History reviewed. No pertinent family history. Social History:  reports that he quit smoking about 12 years ago. His smoking use included cigarettes. He has a 40.00 pack-year smoking history. He has never used smokeless tobacco. He reports current alcohol use. He reports that he does not use drugs.  Allergies:  Allergies  Allergen Reactions  . Lipitor [Atorvastatin] Other (See Comments)    Muscle ache  . Sulfa Antibiotics     unsure    No medications prior to admission.    No results found for this or any previous visit (from the past 48 hour(s)).  No results found.   Pertinent items are noted in HPI.  Height 5\' 1"  (1.549 m), weight 67.1 kg.  General appearance:  alert, cooperative and appears stated age Head: Normocephalic, without obvious abnormality Neck: no JVD Resp: clear to auscultation bilaterally Cardio: regular rate and rhythm, S1, S2 normal, no murmur, click, rub or gallop GI: soft, non-tender; bowel sounds normal; no masses,  no organomegaly Extremities: contracture left middle and ring fingers Pulses: 2+ and symmetric Skin: Skin color, texture, turgor normal. No rashes or lesions Neurologic: Grossly normal Incision/Wound: na  Assessment/Plan  Assessment:  1. Contracture of palmar fascia    Plan: We have again discussed the possibility of fasciectomy with him I do not feel that a segmental fasciectomy will be able to correct the webspace problem that he has and this is going to require a regular subtotal fasciectomy.  Preperi-and postoperative course been discussed along with risks and complications.  He is aware that there is no guarantee to the surgery the possibility of infection recurrence injury to arteries nerves tendons incomplete relief symptoms dystrophy including loss of the finger.  Did have significant spasm after his first surgery on his opposite hand he is scheduled for fasciectomy ring middle fingers left hand as an outpatient under regional anesthesia.  06/10/2020, 8:32 AM

## 2020-06-10 NOTE — Brief Op Note (Signed)
06/10/2020  11:50 AM  PATIENT:  Jason Todd  74 y.o. male  PRE-OPERATIVE DIAGNOSIS:  DUPUYTREN'S CONTRACTURE  POST-OPERATIVE DIAGNOSIS:  Dupytren's Contracture  PROCEDURE:  Procedure(s): FASCIECTOMY LEFT MIDDLE AND RING FINGERS (Left)  SURGEON:  Surgeon(s) and Role:    Cindee Salt, MD - Primary  PHYSICIAN ASSISTANT:   ASSISTANTS: Dasnoit,PAC  ANESTHESIA:   regional and IV sedation  EBL: 2 ml BLOOD ADMINISTERED:none  DRAINS: vessel loo[  LOCAL MEDICATIONS USED:  NONE  SPECIMEN:  Excision  DISPOSITION OF SPECIMEN:  PATHOLOGY  COUNTS:  YES  TOURNIQUET:   Total Tourniquet Time Documented: Upper Arm (Left) - 55 minutes Total: Upper Arm (Left) - 55 minutes   DICTATION: .[name] Dictation  PLAN OF CARE: Discharge to home after PACU  PATIENT DISPOSITION:  PACU - hemodynamically stable.

## 2020-06-10 NOTE — Progress Notes (Signed)
Assisted Dr. Witman with right, ultrasound guided, supraclavicular block. Side rails up, monitors on throughout procedure. See vital signs in flow sheet. Tolerated Procedure well. 

## 2020-06-10 NOTE — Op Note (Signed)
NAME: Jason Todd MEDICAL RECORD NO: [id number] DATE OF BIRTH: Oct 19, 1945 FACILITY: [facility] LOCATION: [facility] PHYSICIAN: Nicki Reaper, MD   OPERATIVE REPORT   DATE OF PROCEDURE: 06/10/20    PREOPERATIVE DIAGNOSIS:   Dupuytren's contracture ring and middle finger left hand   POSTOPERATIVE DIAGNOSIS:   Same   PROCEDURE:   Fasciectomy middle and ring finger left hand with V-Y advancement   SURGEON: Cindee Salt, M.D.   ASSISTANT: Annye Rusk, [facility]   ANESTHESIA:  Regional with sedation   INTRAVENOUS FLUIDS:  Per anesthesia flow sheet.   ESTIMATED BLOOD LOSS:  Minimal.   COMPLICATIONS:  None.   SPECIMENS:   Dupuytren's cord   TOURNIQUET TIME:    Total Tourniquet Time Documented: Upper Arm (Left) - 55 minutes Total: Upper Arm (Left) - 55 minutes    DISPOSITION:  Stable to PACU.   INDICATIONS: Patient is a 74 year old male with history of Dupuytren's contracture bilateral hands.  Is undergone fasciectomy on the right side he is admitted now for fasciectomy on the left including the ring and middle fingers he is well aware of risks and complications including the possibility of infection recurrence injury to arteries nerves tendons complete relief symptoms dystrophy the possibility of finger loss.  Preoperative area the patient seen the extremity marked by both patient and surgeon antibiotic given a supraclavicular block was carried out without difficulty under the direction the anesthesia department.  OPERATIVE COURSE: Patient is brought to the operating room placed in a supine position prepped and draped with ChloraPrep.  A 3-minute dry time was allowed and timeout taken to confirm patient procedure.  A volar Bruner incision was made based over the ring finger primarily with extension to the middle finger the dissection was carried from a proximal to distal direction.  The cord was identified proximally the palmar fascia released at the level of the flexor  retinaculum.  This was to both the ring and middle fingers.  The dissection was then carried distally after identification of the nerve neurovascular bundles.  The cord was followed on the ring finger to the level of the A2 pulley where this was then resected from its attachment to the proximal phalanx on both radial and ulnar aspect there was some extension to the middle finger from the ER radial portion of the cord.  Neurovascular bundles were identified protected throughout that portion of the procedure.  The bundles were then identified distally as the cord became a lateral digital sheet out to the PIP joint.  The artery nerve on each side was protected and routinely bathed with a papaverine wash of 30 mg/mL.  The neurovascular bundle was intact the bundle division to the middle finger was then identified.  A volar incision was made on the middle finger.  This allowed the cord to be followed distally to the middle phalanx texting the neurovascular bundle radially and ulnarly.  A cord was then removed from the middle finger protecting the neurovascular bundle maintaining a skin bridge between the middle and ring fingers.  The cord was primarily a central lateral digital sheet on the ulnar aspect protection of the neurovascular bundle this portion of the cord was removed both fingers came fully straight at both MP and PIP joints the area was then bathed with the remaining papaverine wash.  This was tried and these converted to wise the wound was then bathed with thrombin.  A vessel loop was placed in the depth of each wound and the  wound closed with interrupted 4-0 nylon sutures.  Sterile compressive dressing was applied the tourniquet was deflated all fingers immediately pink.  A dorsal splint was applied and completion of the dressing.  The patient was taken to the recovery room for observation in satisfactory condition.  He will be discharged home to return to the hand center of [place] in 1 week on Norco  5/3/2025mg   Cindee Salt, MD Electronically signed, 06/10/20

## 2020-06-10 NOTE — Anesthesia Postprocedure Evaluation (Signed)
Anesthesia Post Note  Patient: Jason Todd  Procedure(s) Performed: FASCIECTOMY LEFT MIDDLE AND RING FINGERS (Left Hand)     Patient location during evaluation: PACU Anesthesia Type: Regional Level of consciousness: awake and alert Pain management: pain level controlled Vital Signs Assessment: post-procedure vital signs reviewed and stable Respiratory status: spontaneous breathing, nonlabored ventilation and respiratory function stable Cardiovascular status: blood pressure returned to baseline and stable Postop Assessment: no apparent nausea or vomiting Anesthetic complications: no   No complications documented.  Last Vitals:  Vitals:   06/10/20 1200 06/10/20 1215  BP: (!) 156/97 (!) 169/101  Pulse: 96 86  Resp: (!) 24 17  Temp:    SpO2: 97% 100%    Last Pain:  Vitals:   06/10/20 1215  TempSrc:   PainSc: 0-No pain                 Lucretia Kern

## 2020-06-11 ENCOUNTER — Encounter (HOSPITAL_BASED_OUTPATIENT_CLINIC_OR_DEPARTMENT_OTHER): Payer: Self-pay | Admitting: Orthopedic Surgery

## 2020-06-11 LAB — SURGICAL PATHOLOGY

## 2020-08-07 DIAGNOSIS — G5601 Carpal tunnel syndrome, right upper limb: Secondary | ICD-10-CM | POA: Diagnosis not present

## 2020-08-07 DIAGNOSIS — M2560 Stiffness of unspecified joint, not elsewhere classified: Secondary | ICD-10-CM | POA: Diagnosis not present

## 2020-08-07 DIAGNOSIS — R531 Weakness: Secondary | ICD-10-CM | POA: Diagnosis not present

## 2020-08-07 DIAGNOSIS — M720 Palmar fascial fibromatosis [Dupuytren]: Secondary | ICD-10-CM | POA: Diagnosis not present

## 2020-08-07 DIAGNOSIS — M25642 Stiffness of left hand, not elsewhere classified: Secondary | ICD-10-CM | POA: Diagnosis not present

## 2020-08-11 DIAGNOSIS — M25642 Stiffness of left hand, not elsewhere classified: Secondary | ICD-10-CM | POA: Diagnosis not present

## 2020-08-14 DIAGNOSIS — M2560 Stiffness of unspecified joint, not elsewhere classified: Secondary | ICD-10-CM | POA: Diagnosis not present

## 2020-08-14 DIAGNOSIS — R531 Weakness: Secondary | ICD-10-CM | POA: Diagnosis not present

## 2020-08-14 DIAGNOSIS — M25641 Stiffness of right hand, not elsewhere classified: Secondary | ICD-10-CM | POA: Diagnosis not present

## 2020-08-14 DIAGNOSIS — M720 Palmar fascial fibromatosis [Dupuytren]: Secondary | ICD-10-CM | POA: Diagnosis not present

## 2020-08-14 DIAGNOSIS — G5601 Carpal tunnel syndrome, right upper limb: Secondary | ICD-10-CM | POA: Diagnosis not present

## 2020-08-14 DIAGNOSIS — M25642 Stiffness of left hand, not elsewhere classified: Secondary | ICD-10-CM | POA: Diagnosis not present

## 2020-08-14 DIAGNOSIS — M79644 Pain in right finger(s): Secondary | ICD-10-CM | POA: Diagnosis not present

## 2020-08-18 DIAGNOSIS — M25642 Stiffness of left hand, not elsewhere classified: Secondary | ICD-10-CM | POA: Diagnosis not present

## 2020-08-18 DIAGNOSIS — M720 Palmar fascial fibromatosis [Dupuytren]: Secondary | ICD-10-CM | POA: Diagnosis not present

## 2020-08-18 DIAGNOSIS — R531 Weakness: Secondary | ICD-10-CM | POA: Diagnosis not present

## 2020-08-18 DIAGNOSIS — M79644 Pain in right finger(s): Secondary | ICD-10-CM | POA: Diagnosis not present

## 2020-08-18 DIAGNOSIS — G5601 Carpal tunnel syndrome, right upper limb: Secondary | ICD-10-CM | POA: Diagnosis not present

## 2020-08-18 DIAGNOSIS — M2560 Stiffness of unspecified joint, not elsewhere classified: Secondary | ICD-10-CM | POA: Diagnosis not present

## 2020-09-03 DIAGNOSIS — Z961 Presence of intraocular lens: Secondary | ICD-10-CM | POA: Diagnosis not present

## 2020-09-03 DIAGNOSIS — H40013 Open angle with borderline findings, low risk, bilateral: Secondary | ICD-10-CM | POA: Diagnosis not present

## 2020-09-03 DIAGNOSIS — H04123 Dry eye syndrome of bilateral lacrimal glands: Secondary | ICD-10-CM | POA: Diagnosis not present

## 2020-09-03 DIAGNOSIS — H35033 Hypertensive retinopathy, bilateral: Secondary | ICD-10-CM | POA: Diagnosis not present

## 2020-09-08 ENCOUNTER — Other Ambulatory Visit: Payer: Self-pay | Admitting: *Deleted

## 2020-09-08 DIAGNOSIS — Z87891 Personal history of nicotine dependence: Secondary | ICD-10-CM

## 2020-09-29 ENCOUNTER — Ambulatory Visit
Admission: RE | Admit: 2020-09-29 | Discharge: 2020-09-29 | Disposition: A | Discharge status: Home / Self Care | Payer: Medicare PPO | Source: Ambulatory Visit | Attending: Acute Care | Admitting: Acute Care

## 2020-09-29 DIAGNOSIS — Z87891 Personal history of nicotine dependence: Secondary | ICD-10-CM

## 2020-09-30 NOTE — Progress Notes (Signed)
Please call patient and let them  know their  low dose Ct was read as a Lung RADS 2: nodules that are benign in appearance and behavior with a very low likelihood of becoming a clinically active cancer due to size or lack of growth. Recommendation per radiology is for a repeat LDCT in 12 months. .Please let them  know we will order and schedule their  annual screening scan for 09/2021. Please let them  know there was notation of CAD on their  scan.  Please remind the patient  that this is a non-gated exam therefore degree or severity of disease  cannot be determined. Please have them  follow up with their PCP regarding potential risk factor modification, dietary therapy or pharmacologic therapy if clinically indicated. Pt.  is  currently  on statin therapy. Please place order for annual  screening scan for  09/2021 and fax results to PCP. Thanks so much.  There is notation of CAD and a thoracic aorta that we will monitor annually.

## 2020-10-01 ENCOUNTER — Other Ambulatory Visit: Payer: Self-pay | Admitting: *Deleted

## 2020-10-01 DIAGNOSIS — Z87891 Personal history of nicotine dependence: Secondary | ICD-10-CM

## 2020-11-06 DIAGNOSIS — M5136 Other intervertebral disc degeneration, lumbar region: Secondary | ICD-10-CM | POA: Diagnosis not present

## 2020-11-06 DIAGNOSIS — M9903 Segmental and somatic dysfunction of lumbar region: Secondary | ICD-10-CM | POA: Diagnosis not present

## 2021-03-05 DIAGNOSIS — S80261A Insect bite (nonvenomous), right knee, initial encounter: Secondary | ICD-10-CM | POA: Diagnosis not present

## 2021-03-05 DIAGNOSIS — W57XXXA Bitten or stung by nonvenomous insect and other nonvenomous arthropods, initial encounter: Secondary | ICD-10-CM | POA: Diagnosis not present

## 2021-03-05 DIAGNOSIS — L299 Pruritus, unspecified: Secondary | ICD-10-CM | POA: Diagnosis not present

## 2021-03-30 DIAGNOSIS — Z800 Family history of malignant neoplasm of digestive organs: Secondary | ICD-10-CM | POA: Diagnosis not present

## 2021-03-30 DIAGNOSIS — Z1211 Encounter for screening for malignant neoplasm of colon: Secondary | ICD-10-CM | POA: Diagnosis not present

## 2021-03-30 DIAGNOSIS — K5730 Diverticulosis of large intestine without perforation or abscess without bleeding: Secondary | ICD-10-CM | POA: Diagnosis not present

## 2021-03-30 DIAGNOSIS — K640 First degree hemorrhoids: Secondary | ICD-10-CM | POA: Diagnosis not present

## 2021-03-30 DIAGNOSIS — D120 Benign neoplasm of cecum: Secondary | ICD-10-CM | POA: Diagnosis not present

## 2021-04-01 DIAGNOSIS — D120 Benign neoplasm of cecum: Secondary | ICD-10-CM | POA: Diagnosis not present

## 2021-06-09 DIAGNOSIS — Z79899 Other long term (current) drug therapy: Secondary | ICD-10-CM | POA: Diagnosis not present

## 2021-06-09 DIAGNOSIS — J439 Emphysema, unspecified: Secondary | ICD-10-CM | POA: Diagnosis not present

## 2021-06-09 DIAGNOSIS — I700 Atherosclerosis of aorta: Secondary | ICD-10-CM | POA: Diagnosis not present

## 2021-06-09 DIAGNOSIS — Z0001 Encounter for general adult medical examination with abnormal findings: Secondary | ICD-10-CM | POA: Diagnosis not present

## 2021-06-09 DIAGNOSIS — H35033 Hypertensive retinopathy, bilateral: Secondary | ICD-10-CM | POA: Diagnosis not present

## 2021-06-09 DIAGNOSIS — I10 Essential (primary) hypertension: Secondary | ICD-10-CM | POA: Diagnosis not present

## 2021-06-09 DIAGNOSIS — I77819 Aortic ectasia, unspecified site: Secondary | ICD-10-CM | POA: Diagnosis not present

## 2021-06-09 DIAGNOSIS — B009 Herpesviral infection, unspecified: Secondary | ICD-10-CM | POA: Diagnosis not present

## 2021-06-09 DIAGNOSIS — E7800 Pure hypercholesterolemia, unspecified: Secondary | ICD-10-CM | POA: Diagnosis not present

## 2021-06-09 DIAGNOSIS — G4700 Insomnia, unspecified: Secondary | ICD-10-CM | POA: Diagnosis not present

## 2021-06-09 DIAGNOSIS — M1A9XX0 Chronic gout, unspecified, without tophus (tophi): Secondary | ICD-10-CM | POA: Diagnosis not present

## 2021-07-06 DIAGNOSIS — M5136 Other intervertebral disc degeneration, lumbar region: Secondary | ICD-10-CM | POA: Diagnosis not present

## 2021-07-06 DIAGNOSIS — M9903 Segmental and somatic dysfunction of lumbar region: Secondary | ICD-10-CM | POA: Diagnosis not present

## 2021-07-08 DIAGNOSIS — M9903 Segmental and somatic dysfunction of lumbar region: Secondary | ICD-10-CM | POA: Diagnosis not present

## 2021-07-08 DIAGNOSIS — M5136 Other intervertebral disc degeneration, lumbar region: Secondary | ICD-10-CM | POA: Diagnosis not present

## 2021-07-15 DIAGNOSIS — L820 Inflamed seborrheic keratosis: Secondary | ICD-10-CM | POA: Diagnosis not present

## 2021-07-15 DIAGNOSIS — L538 Other specified erythematous conditions: Secondary | ICD-10-CM | POA: Diagnosis not present

## 2021-07-15 DIAGNOSIS — Z789 Other specified health status: Secondary | ICD-10-CM | POA: Diagnosis not present

## 2021-09-03 DIAGNOSIS — H524 Presbyopia: Secondary | ICD-10-CM | POA: Diagnosis not present

## 2021-09-03 DIAGNOSIS — H04123 Dry eye syndrome of bilateral lacrimal glands: Secondary | ICD-10-CM | POA: Diagnosis not present

## 2021-09-03 DIAGNOSIS — Z961 Presence of intraocular lens: Secondary | ICD-10-CM | POA: Diagnosis not present

## 2021-09-03 DIAGNOSIS — H40013 Open angle with borderline findings, low risk, bilateral: Secondary | ICD-10-CM | POA: Diagnosis not present

## 2021-09-03 DIAGNOSIS — L719 Rosacea, unspecified: Secondary | ICD-10-CM | POA: Diagnosis not present

## 2021-11-06 ENCOUNTER — Telehealth: Payer: Self-pay | Admitting: *Deleted

## 2021-11-06 NOTE — Telephone Encounter (Signed)
LMTC to schedule Yearly Lung CA CT Scan. 

## 2021-11-11 ENCOUNTER — Other Ambulatory Visit: Payer: Self-pay

## 2021-11-11 DIAGNOSIS — Z122 Encounter for screening for malignant neoplasm of respiratory organs: Secondary | ICD-10-CM

## 2021-11-11 DIAGNOSIS — Z87891 Personal history of nicotine dependence: Secondary | ICD-10-CM

## 2021-11-27 ENCOUNTER — Ambulatory Visit
Admission: RE | Admit: 2021-11-27 | Discharge: 2021-11-27 | Disposition: A | Discharge status: Home / Self Care | Payer: Medicare PPO | Source: Ambulatory Visit | Attending: Family Medicine | Admitting: Family Medicine

## 2021-11-27 DIAGNOSIS — Z87891 Personal history of nicotine dependence: Secondary | ICD-10-CM | POA: Diagnosis not present

## 2021-11-27 DIAGNOSIS — Z122 Encounter for screening for malignant neoplasm of respiratory organs: Secondary | ICD-10-CM

## 2021-12-01 ENCOUNTER — Other Ambulatory Visit: Payer: Self-pay | Admitting: Acute Care

## 2021-12-01 DIAGNOSIS — Z122 Encounter for screening for malignant neoplasm of respiratory organs: Secondary | ICD-10-CM

## 2021-12-01 DIAGNOSIS — Z87891 Personal history of nicotine dependence: Secondary | ICD-10-CM

## 2021-12-17 DIAGNOSIS — H6123 Impacted cerumen, bilateral: Secondary | ICD-10-CM | POA: Diagnosis not present

## 2022-01-13 DIAGNOSIS — H6121 Impacted cerumen, right ear: Secondary | ICD-10-CM | POA: Diagnosis not present

## 2022-01-13 DIAGNOSIS — H903 Sensorineural hearing loss, bilateral: Secondary | ICD-10-CM | POA: Diagnosis not present

## 2022-01-27 DIAGNOSIS — H6121 Impacted cerumen, right ear: Secondary | ICD-10-CM | POA: Diagnosis not present

## 2022-01-27 DIAGNOSIS — H903 Sensorineural hearing loss, bilateral: Secondary | ICD-10-CM | POA: Diagnosis not present

## 2022-01-29 DIAGNOSIS — D485 Neoplasm of uncertain behavior of skin: Secondary | ICD-10-CM | POA: Diagnosis not present

## 2022-01-29 DIAGNOSIS — C44622 Squamous cell carcinoma of skin of right upper limb, including shoulder: Secondary | ICD-10-CM | POA: Diagnosis not present

## 2022-03-02 DIAGNOSIS — S6991XA Unspecified injury of right wrist, hand and finger(s), initial encounter: Secondary | ICD-10-CM | POA: Diagnosis not present

## 2022-03-02 DIAGNOSIS — M720 Palmar fascial fibromatosis [Dupuytren]: Secondary | ICD-10-CM | POA: Diagnosis not present

## 2022-03-17 DIAGNOSIS — C44622 Squamous cell carcinoma of skin of right upper limb, including shoulder: Secondary | ICD-10-CM | POA: Diagnosis not present

## 2022-05-10 DIAGNOSIS — H903 Sensorineural hearing loss, bilateral: Secondary | ICD-10-CM | POA: Diagnosis not present

## 2022-05-10 DIAGNOSIS — H6123 Impacted cerumen, bilateral: Secondary | ICD-10-CM | POA: Diagnosis not present

## 2022-06-17 DIAGNOSIS — R7303 Prediabetes: Secondary | ICD-10-CM | POA: Diagnosis not present

## 2022-06-17 DIAGNOSIS — Z0001 Encounter for general adult medical examination with abnormal findings: Secondary | ICD-10-CM | POA: Diagnosis not present

## 2022-06-17 DIAGNOSIS — Z79899 Other long term (current) drug therapy: Secondary | ICD-10-CM | POA: Diagnosis not present

## 2022-06-17 DIAGNOSIS — E7800 Pure hypercholesterolemia, unspecified: Secondary | ICD-10-CM | POA: Diagnosis not present

## 2022-06-17 DIAGNOSIS — I10 Essential (primary) hypertension: Secondary | ICD-10-CM | POA: Diagnosis not present

## 2022-06-17 DIAGNOSIS — M1A9XX0 Chronic gout, unspecified, without tophus (tophi): Secondary | ICD-10-CM | POA: Diagnosis not present

## 2022-06-17 DIAGNOSIS — B009 Herpesviral infection, unspecified: Secondary | ICD-10-CM | POA: Diagnosis not present

## 2022-06-17 DIAGNOSIS — J439 Emphysema, unspecified: Secondary | ICD-10-CM | POA: Diagnosis not present

## 2022-06-17 DIAGNOSIS — H35033 Hypertensive retinopathy, bilateral: Secondary | ICD-10-CM | POA: Diagnosis not present

## 2022-06-17 DIAGNOSIS — I700 Atherosclerosis of aorta: Secondary | ICD-10-CM | POA: Diagnosis not present

## 2022-08-02 DIAGNOSIS — H903 Sensorineural hearing loss, bilateral: Secondary | ICD-10-CM | POA: Diagnosis not present

## 2022-08-02 DIAGNOSIS — H6123 Impacted cerumen, bilateral: Secondary | ICD-10-CM | POA: Diagnosis not present

## 2022-09-09 DIAGNOSIS — H524 Presbyopia: Secondary | ICD-10-CM | POA: Diagnosis not present

## 2022-09-09 DIAGNOSIS — H40013 Open angle with borderline findings, low risk, bilateral: Secondary | ICD-10-CM | POA: Diagnosis not present

## 2022-09-09 DIAGNOSIS — H04123 Dry eye syndrome of bilateral lacrimal glands: Secondary | ICD-10-CM | POA: Diagnosis not present

## 2022-09-09 DIAGNOSIS — L719 Rosacea, unspecified: Secondary | ICD-10-CM | POA: Diagnosis not present

## 2022-09-09 DIAGNOSIS — H0102A Squamous blepharitis right eye, upper and lower eyelids: Secondary | ICD-10-CM | POA: Diagnosis not present

## 2022-11-01 DIAGNOSIS — H6123 Impacted cerumen, bilateral: Secondary | ICD-10-CM | POA: Diagnosis not present

## 2022-11-01 DIAGNOSIS — H903 Sensorineural hearing loss, bilateral: Secondary | ICD-10-CM | POA: Diagnosis not present

## 2022-11-04 ENCOUNTER — Other Ambulatory Visit: Payer: Self-pay | Admitting: Acute Care

## 2022-11-04 DIAGNOSIS — Z122 Encounter for screening for malignant neoplasm of respiratory organs: Secondary | ICD-10-CM

## 2022-11-04 DIAGNOSIS — Z87891 Personal history of nicotine dependence: Secondary | ICD-10-CM

## 2022-12-02 ENCOUNTER — Ambulatory Visit
Admission: RE | Admit: 2022-12-02 | Discharge: 2022-12-02 | Disposition: A | Discharge status: Home / Self Care | Payer: Medicare PPO | Source: Ambulatory Visit | Attending: Acute Care | Admitting: Acute Care

## 2022-12-02 DIAGNOSIS — Z122 Encounter for screening for malignant neoplasm of respiratory organs: Secondary | ICD-10-CM

## 2022-12-02 DIAGNOSIS — Z87891 Personal history of nicotine dependence: Secondary | ICD-10-CM | POA: Diagnosis not present

## 2022-12-16 DIAGNOSIS — I7121 Aneurysm of the ascending aorta, without rupture: Secondary | ICD-10-CM | POA: Diagnosis not present

## 2022-12-16 DIAGNOSIS — J439 Emphysema, unspecified: Secondary | ICD-10-CM | POA: Diagnosis not present

## 2022-12-16 DIAGNOSIS — I10 Essential (primary) hypertension: Secondary | ICD-10-CM | POA: Diagnosis not present

## 2022-12-16 DIAGNOSIS — Z79899 Other long term (current) drug therapy: Secondary | ICD-10-CM | POA: Diagnosis not present

## 2022-12-16 DIAGNOSIS — I700 Atherosclerosis of aorta: Secondary | ICD-10-CM | POA: Diagnosis not present

## 2022-12-19 IMAGING — CT CT CHEST LUNG CANCER SCREENING LOW DOSE W/O CM
2 of 5 series · 15 of 40 positions shown, 18 images · non-contrast
Comparison: 08/17/2019 screening chest CT.

CLINICAL DATA: 74-year-old asymptomatic male former smoker with 40
pack-year smoking history, quit smoking 13 years prior.

EXAM:
CT CHEST WITHOUT CONTRAST LOW-DOSE FOR LUNG CANCER SCREENING
TECHNIQUE: Multidetector CT imaging of the chest was performed following the
standard protocol without IV contrast.

[Series 9: lung 1.00 br44 cor · coronal · 0.56mm/px · 3 of 350 slices shown]
[im 70/350  lung]
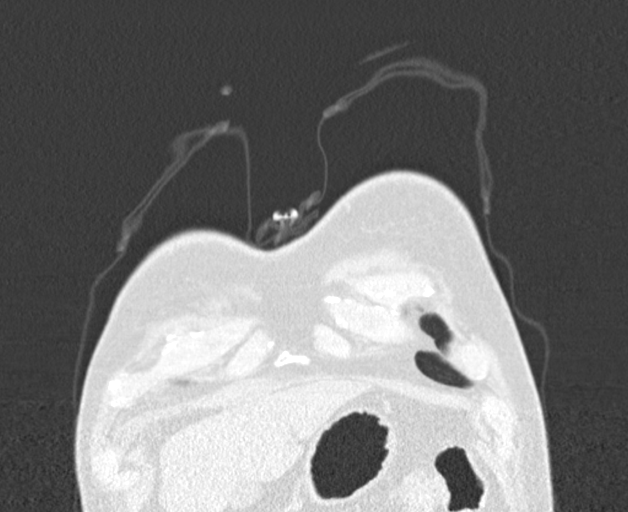
[im 140/350  lung]
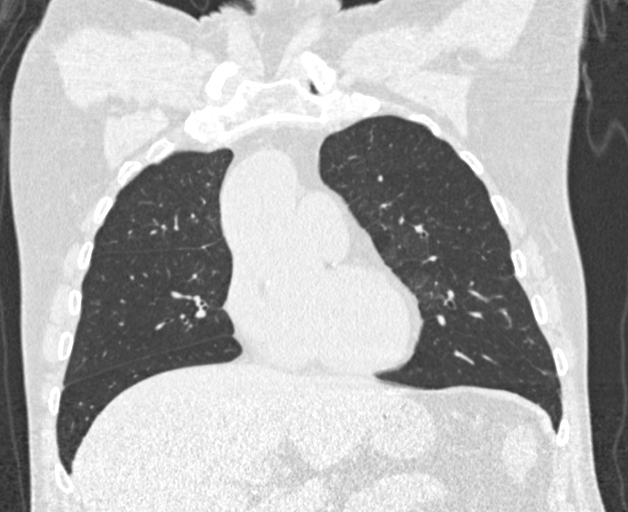
[im 210/350  lung]
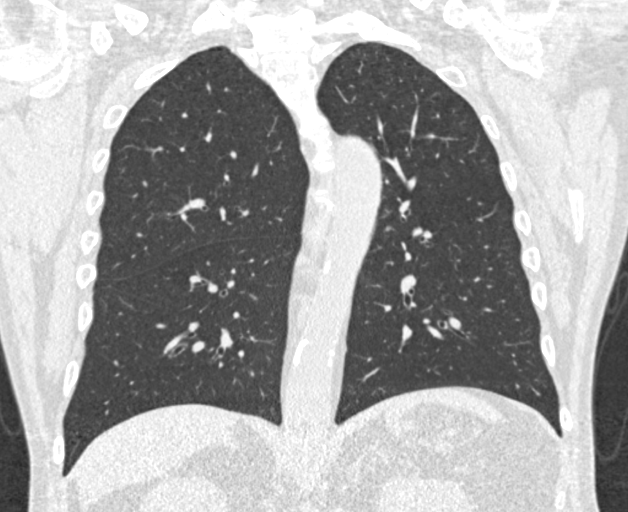

[Series 14: lung 1.00 br60 axial · axial · 0.69mm/px · z∈[-860,-602]mm · 12 of 286 slices shown, 15 images]
[im 14/286  mediastinal]
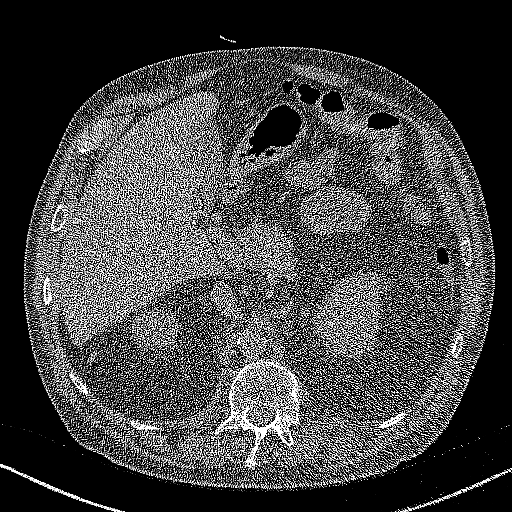
[im 14/286  lung]
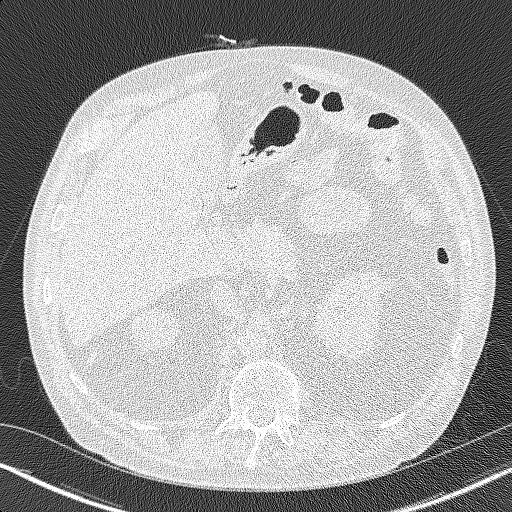
[im 41/286  lung]
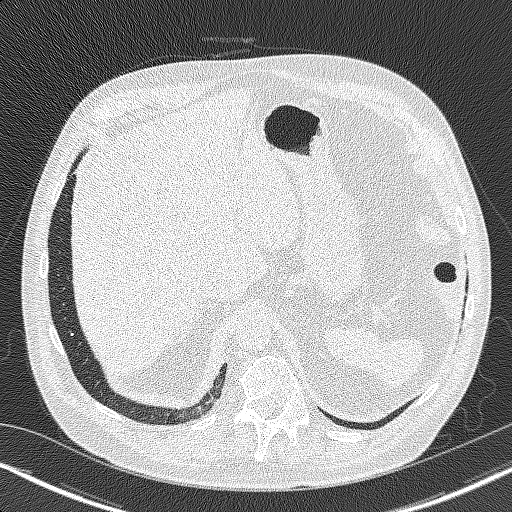
[im 68/286  lung]
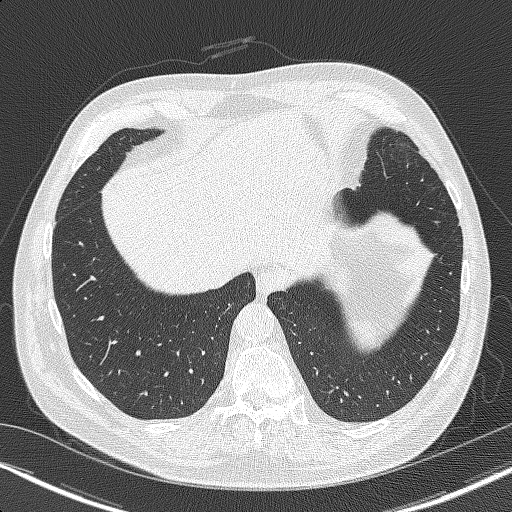
[im 82/286  lung]
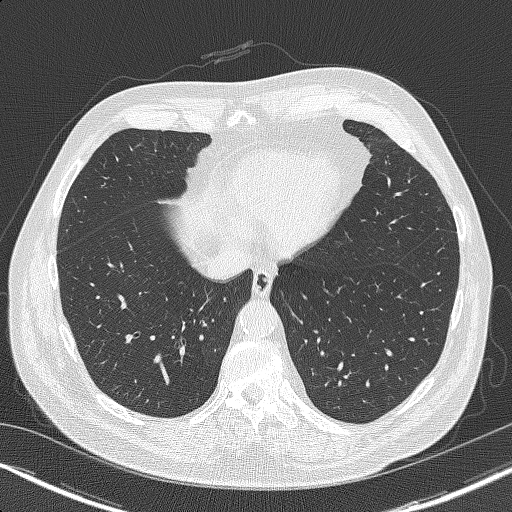
[im 109/286  mediastinal]
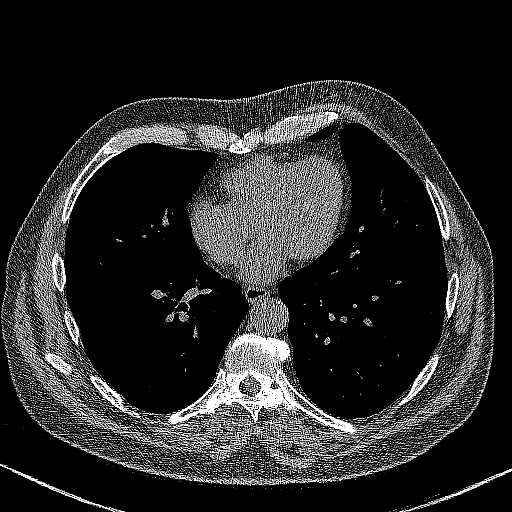
[im 109/286  lung]
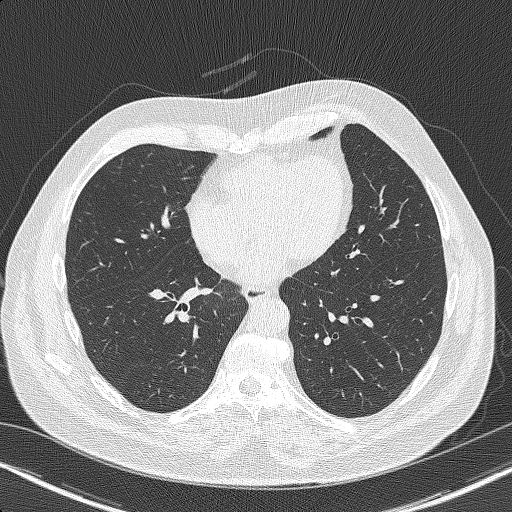
[im 136/286  lung]
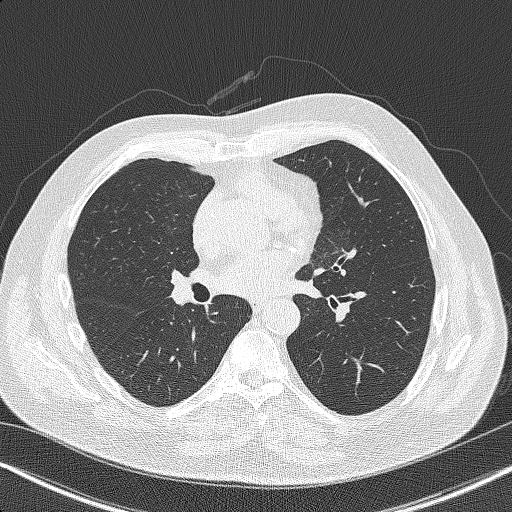
[im 150/286  lung]
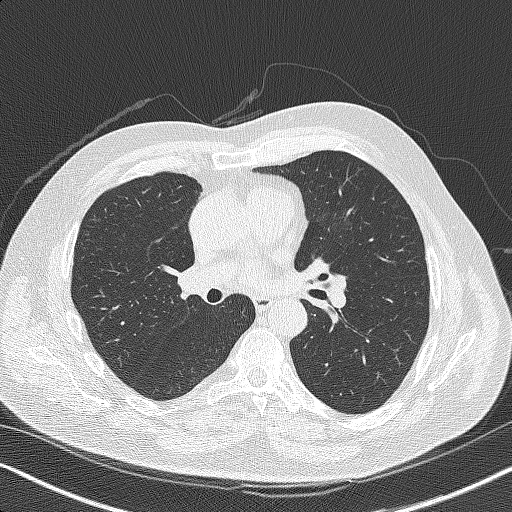
[im 177/286  lung]
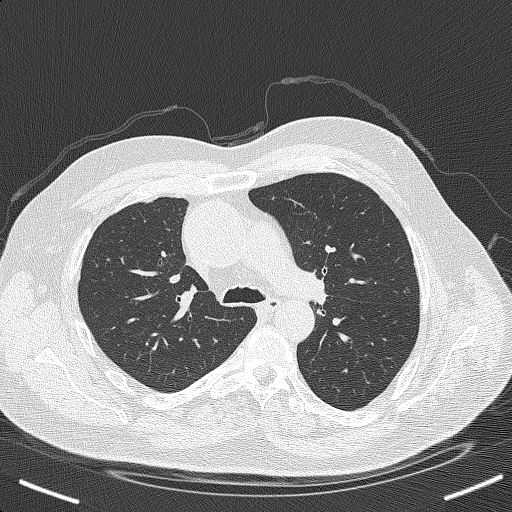
[im 204/286  mediastinal]
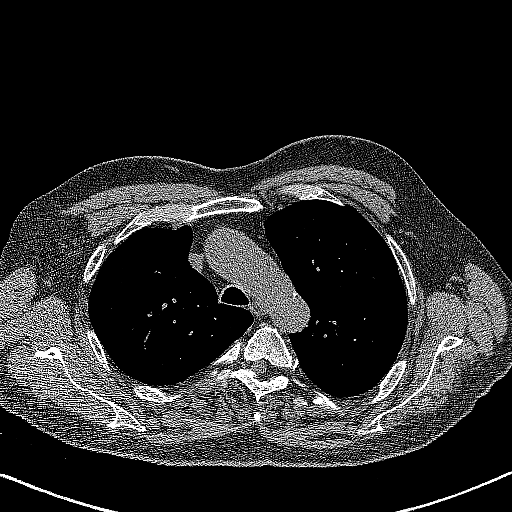
[im 204/286  lung]
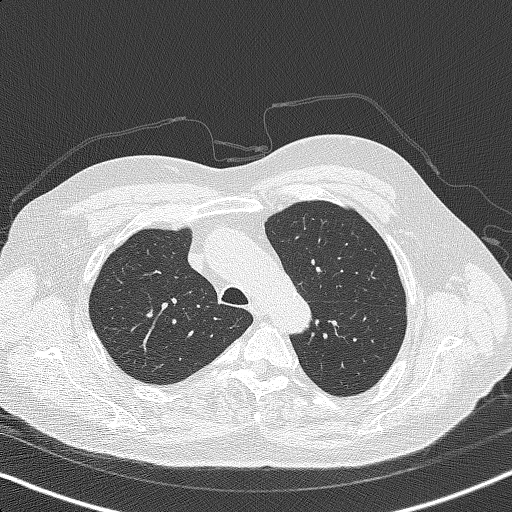
[im 218/286  lung]
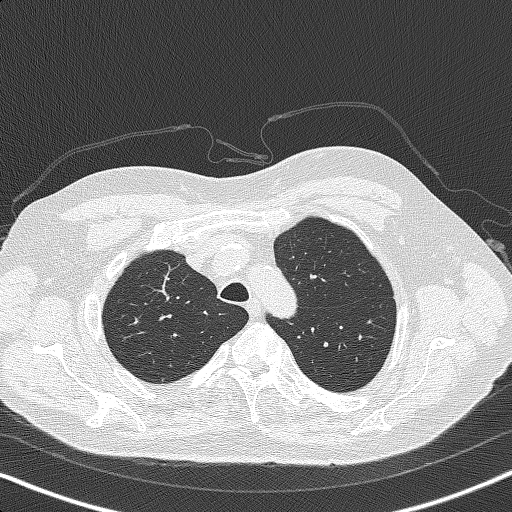
[im 245/286  lung]
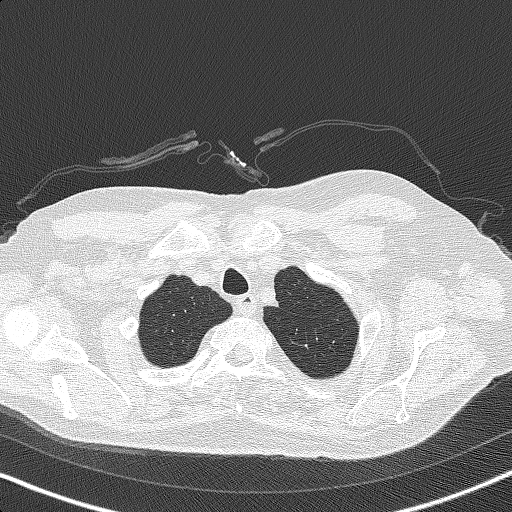
[im 272/286  lung]
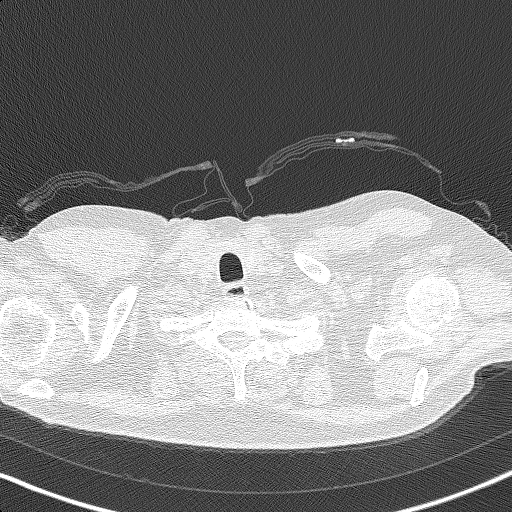

[15 of 40 positions shown; findings below may reference images not displayed]

FINDINGS: Cardiovascular: Normal heart size. No significant pericardial
effusion/thickening. Left anterior descending and left circumflex
coronary atherosclerosis. Atherosclerotic thoracic aorta with
ectatic 4.2 cm ascending thoracic aorta, stable. Normal caliber
pulmonary arteries.

Mediastinum/Nodes: Stable subcentimeter hypodense right thyroid
nodule. Not clinically significant; no follow-up imaging recommended
(ref: [HOSPITAL]. [DATE]): 143-50). Unremarkable
esophagus. No pathologically enlarged axillary, mediastinal or hilar
lymph nodes, noting limited sensitivity for the detection of hilar
adenopathy on this noncontrast study.

Lungs/Pleura: No pneumothorax. No pleural effusion. Mild
centrilobular emphysema. No acute consolidative airspace disease or
lung masses. No significant growth of the previously visualized
scattered small pulmonary nodules. No new significant pulmonary
nodules.

Upper abdomen: No acute abnormality.

Musculoskeletal: No aggressive appearing focal osseous lesions.
Moderate thoracic spondylosis.
IMPRESSION: 1. Lung-RADS 2, benign appearance or behavior. Continue annual
screening with low-dose chest CT without contrast in 12 months.
2. Stable ectatic 4.2 cm ascending thoracic aorta, which can be
reassessed on follow-up screening chest CT in 12 months.
3. Two-vessel coronary atherosclerosis.
4. Aortic Atherosclerosis (VAPRG-BWN.N) and Emphysema (VAPRG-NY7.9).

## 2023-03-04 DIAGNOSIS — H903 Sensorineural hearing loss, bilateral: Secondary | ICD-10-CM | POA: Diagnosis not present

## 2023-03-04 DIAGNOSIS — H6123 Impacted cerumen, bilateral: Secondary | ICD-10-CM | POA: Diagnosis not present

## 2023-06-15 DIAGNOSIS — Z79899 Other long term (current) drug therapy: Secondary | ICD-10-CM | POA: Diagnosis not present

## 2023-06-15 DIAGNOSIS — Z0001 Encounter for general adult medical examination with abnormal findings: Secondary | ICD-10-CM | POA: Diagnosis not present

## 2023-06-15 DIAGNOSIS — I7121 Aneurysm of the ascending aorta, without rupture: Secondary | ICD-10-CM | POA: Diagnosis not present

## 2023-06-15 DIAGNOSIS — I700 Atherosclerosis of aorta: Secondary | ICD-10-CM | POA: Diagnosis not present

## 2023-06-15 DIAGNOSIS — Z1331 Encounter for screening for depression: Secondary | ICD-10-CM | POA: Diagnosis not present

## 2023-06-15 DIAGNOSIS — R7301 Impaired fasting glucose: Secondary | ICD-10-CM | POA: Diagnosis not present

## 2023-06-15 DIAGNOSIS — E7800 Pure hypercholesterolemia, unspecified: Secondary | ICD-10-CM | POA: Diagnosis not present

## 2023-06-15 DIAGNOSIS — M1A9XX0 Chronic gout, unspecified, without tophus (tophi): Secondary | ICD-10-CM | POA: Diagnosis not present

## 2023-06-15 DIAGNOSIS — I10 Essential (primary) hypertension: Secondary | ICD-10-CM | POA: Diagnosis not present

## 2023-06-15 DIAGNOSIS — J439 Emphysema, unspecified: Secondary | ICD-10-CM | POA: Diagnosis not present

## 2023-07-22 DIAGNOSIS — H6123 Impacted cerumen, bilateral: Secondary | ICD-10-CM | POA: Diagnosis not present

## 2023-07-22 DIAGNOSIS — H6063 Unspecified chronic otitis externa, bilateral: Secondary | ICD-10-CM | POA: Diagnosis not present

## 2023-09-12 DIAGNOSIS — H43813 Vitreous degeneration, bilateral: Secondary | ICD-10-CM | POA: Diagnosis not present

## 2023-09-12 DIAGNOSIS — L719 Rosacea, unspecified: Secondary | ICD-10-CM | POA: Diagnosis not present

## 2023-09-12 DIAGNOSIS — H40013 Open angle with borderline findings, low risk, bilateral: Secondary | ICD-10-CM | POA: Diagnosis not present

## 2023-09-12 DIAGNOSIS — H04123 Dry eye syndrome of bilateral lacrimal glands: Secondary | ICD-10-CM | POA: Diagnosis not present

## 2023-11-21 DIAGNOSIS — H903 Sensorineural hearing loss, bilateral: Secondary | ICD-10-CM | POA: Diagnosis not present

## 2023-11-21 DIAGNOSIS — H6123 Impacted cerumen, bilateral: Secondary | ICD-10-CM | POA: Diagnosis not present

## 2023-12-12 DIAGNOSIS — J439 Emphysema, unspecified: Secondary | ICD-10-CM | POA: Diagnosis not present

## 2023-12-12 DIAGNOSIS — I10 Essential (primary) hypertension: Secondary | ICD-10-CM | POA: Diagnosis not present

## 2023-12-12 DIAGNOSIS — I7121 Aneurysm of the ascending aorta, without rupture: Secondary | ICD-10-CM | POA: Diagnosis not present

## 2023-12-13 ENCOUNTER — Other Ambulatory Visit: Payer: Self-pay | Admitting: Family Medicine

## 2023-12-13 DIAGNOSIS — I7121 Aneurysm of the ascending aorta, without rupture: Secondary | ICD-10-CM

## 2023-12-20 ENCOUNTER — Inpatient Hospital Stay: Admission: RE | Admit: 2023-12-20 | Source: Ambulatory Visit

## 2023-12-30 ENCOUNTER — Ambulatory Visit
Admission: RE | Admit: 2023-12-30 | Discharge: 2023-12-30 | Disposition: A | Discharge status: Home / Self Care | Source: Ambulatory Visit | Attending: Family Medicine | Admitting: Family Medicine

## 2023-12-30 DIAGNOSIS — I7121 Aneurysm of the ascending aorta, without rupture: Secondary | ICD-10-CM | POA: Diagnosis not present

## 2023-12-30 DIAGNOSIS — R918 Other nonspecific abnormal finding of lung field: Secondary | ICD-10-CM | POA: Diagnosis not present

## 2023-12-30 DIAGNOSIS — I2510 Atherosclerotic heart disease of native coronary artery without angina pectoris: Secondary | ICD-10-CM | POA: Diagnosis not present

## 2023-12-30 MED ORDER — IOPAMIDOL (ISOVUE-370) INJECTION 76%
75.0000 mL | Freq: Once | INTRAVENOUS | Status: AC | PRN
  Administered 2023-12-30: 75 mL via INTRAVENOUS

## 2024-01-20 DIAGNOSIS — L989 Disorder of the skin and subcutaneous tissue, unspecified: Secondary | ICD-10-CM | POA: Diagnosis not present

## 2024-01-24 DIAGNOSIS — L570 Actinic keratosis: Secondary | ICD-10-CM | POA: Diagnosis not present

## 2024-04-03 DIAGNOSIS — R195 Other fecal abnormalities: Secondary | ICD-10-CM | POA: Diagnosis not present

## 2024-04-03 DIAGNOSIS — Z03818 Encounter for observation for suspected exposure to other biological agents ruled out: Secondary | ICD-10-CM | POA: Diagnosis not present

## 2024-04-03 DIAGNOSIS — R110 Nausea: Secondary | ICD-10-CM | POA: Diagnosis not present

## 2024-05-17 DIAGNOSIS — H6092 Unspecified otitis externa, left ear: Secondary | ICD-10-CM | POA: Diagnosis not present

## 2024-05-22 DIAGNOSIS — H903 Sensorineural hearing loss, bilateral: Secondary | ICD-10-CM | POA: Diagnosis not present

## 2024-05-22 DIAGNOSIS — H6122 Impacted cerumen, left ear: Secondary | ICD-10-CM | POA: Diagnosis not present

## 2024-05-27 ENCOUNTER — Emergency Department (HOSPITAL_BASED_OUTPATIENT_CLINIC_OR_DEPARTMENT_OTHER)

## 2024-05-27 ENCOUNTER — Encounter (HOSPITAL_BASED_OUTPATIENT_CLINIC_OR_DEPARTMENT_OTHER): Payer: Self-pay

## 2024-05-27 ENCOUNTER — Emergency Department (HOSPITAL_BASED_OUTPATIENT_CLINIC_OR_DEPARTMENT_OTHER)
Admission: EM | Admit: 2024-05-27 | Discharge: 2024-05-27 | Disposition: A | Discharge status: Home / Self Care | Attending: Emergency Medicine | Admitting: Emergency Medicine

## 2024-05-27 ENCOUNTER — Other Ambulatory Visit: Payer: Self-pay

## 2024-05-27 DIAGNOSIS — Z79899 Other long term (current) drug therapy: Secondary | ICD-10-CM | POA: Insufficient documentation

## 2024-05-27 DIAGNOSIS — I10 Essential (primary) hypertension: Secondary | ICD-10-CM | POA: Insufficient documentation

## 2024-05-27 DIAGNOSIS — R1032 Left lower quadrant pain: Secondary | ICD-10-CM

## 2024-05-27 DIAGNOSIS — K5792 Diverticulitis of intestine, part unspecified, without perforation or abscess without bleeding: Secondary | ICD-10-CM

## 2024-05-27 DIAGNOSIS — R509 Fever, unspecified: Secondary | ICD-10-CM | POA: Diagnosis not present

## 2024-05-27 DIAGNOSIS — D72829 Elevated white blood cell count, unspecified: Secondary | ICD-10-CM | POA: Insufficient documentation

## 2024-05-27 DIAGNOSIS — K5732 Diverticulitis of large intestine without perforation or abscess without bleeding: Secondary | ICD-10-CM | POA: Diagnosis not present

## 2024-05-27 DIAGNOSIS — K5730 Diverticulosis of large intestine without perforation or abscess without bleeding: Secondary | ICD-10-CM | POA: Diagnosis not present

## 2024-05-27 LAB — COMPREHENSIVE METABOLIC PANEL WITH GFR
ALT: 17 U/L (ref 0–44)
AST: 26 U/L (ref 15–41)
Albumin: 4.2 g/dL (ref 3.5–5.0)
Alkaline Phosphatase: 77 U/L (ref 38–126)
Anion gap: 15 (ref 5–15)
BUN: 15 mg/dL (ref 8–23)
CO2: 22 mmol/L (ref 22–32)
Calcium: 9.5 mg/dL (ref 8.9–10.3)
Chloride: 99 mmol/L (ref 98–111)
Creatinine, Ser: 1.16 mg/dL (ref 0.61–1.24)
GFR, Estimated: >60 mL/min (ref >60)
Glucose, Bld: 100 mg/dL — ABNORMAL HIGH (ref 70–99)
Potassium: 4.3 mmol/L (ref 3.5–5.1)
Sodium: 136 mmol/L (ref 135–145)
Total Bilirubin: 0.8 mg/dL (ref 0.0–1.2)
Total Protein: 7.6 g/dL (ref 6.5–8.1)

## 2024-05-27 LAB — CBC WITH DIFFERENTIAL/PLATELET
Abs Immature Granulocytes: 0.03 K/uL (ref 0.00–0.07)
Basophils Absolute: 0 K/uL (ref 0.0–0.1)
Basophils Relative: 0 %
Eosinophils Absolute: 0.2 K/uL (ref 0.0–0.5)
Eosinophils Relative: 2 %
HCT: 36.1 % — ABNORMAL LOW (ref 39.0–52.0)
Hemoglobin: 12.6 g/dL — ABNORMAL LOW (ref 13.0–17.0)
Immature Granulocytes: 0 %
Lymphocytes Relative: 16 %
Lymphs Abs: 1.8 K/uL (ref 0.7–4.0)
MCH: 33.6 pg (ref 26.0–34.0)
MCHC: 34.9 g/dL (ref 30.0–36.0)
MCV: 96.3 fL (ref 80.0–100.0)
Monocytes Absolute: 1.1 K/uL — ABNORMAL HIGH (ref 0.1–1.0)
Monocytes Relative: 10 %
Neutro Abs: 8.1 K/uL — ABNORMAL HIGH (ref 1.7–7.7)
Neutrophils Relative %: 72 %
Platelets: 274 K/uL (ref 150–400)
RBC: 3.75 MIL/uL — ABNORMAL LOW (ref 4.22–5.81)
RDW: 14.5 % (ref 11.5–15.5)
WBC: 11.3 K/uL — ABNORMAL HIGH (ref 4.0–10.5)
nRBC: 0 % (ref 0.0–0.2)

## 2024-05-27 LAB — URINALYSIS, ROUTINE W REFLEX MICROSCOPIC
Bacteria, UA: NONE SEEN
Bilirubin Urine: NEGATIVE
Glucose, UA: NEGATIVE mg/dL
Hgb urine dipstick: NEGATIVE
Leukocytes,Ua: NEGATIVE
Nitrite: NEGATIVE
Protein, ur: NEGATIVE mg/dL
Specific Gravity, Urine: 1.014 (ref 1.005–1.030)
pH: 6 (ref 5.0–8.0)

## 2024-05-27 LAB — LIPASE, BLOOD: Lipase: 35 U/L (ref 11–51)

## 2024-05-27 MED ORDER — IOHEXOL 300 MG/ML  SOLN
100.0000 mL | Freq: Once | INTRAMUSCULAR | Status: AC | PRN
  Administered 2024-05-27: 100 mL via INTRAVENOUS

## 2024-05-27 MED ORDER — AMOXICILLIN-POT CLAVULANATE 875-125 MG PO TABS: 1.0000 | ORAL_TABLET | Freq: Two times a day (BID) | ORAL | 0 refills | Status: AC

## 2024-05-27 MED ORDER — AMOXICILLIN-POT CLAVULANATE 875-125 MG PO TABS
1.0000 | ORAL_TABLET | Freq: Once | ORAL | Status: AC
  Administered 2024-05-27: 1 via ORAL
  Filled 2024-05-27: qty 1

## 2024-05-27 NOTE — ED Provider Notes (Addendum)
 [place] EMERGENCY DEPARTMENT AT [facility] Provider Note   CSN: [id number] Arrival date & time: 05/27/24  1503     Patient presents with: Abdominal Pain   Jason Todd is a 78 y.o. male.   Patient with a complaint of left lower quadrant abdominal pain that started Tuesday evening.  Associated with some loose bowel movements no blood in the bowel movements no nausea no vomiting.  Patient was seen at [facility] walk-in clinic today and they sent him over for evaluation.  Patient questionable fever but temp here 99.1 pulse 100 respirations 18 blood pressure 152/87 oxygen saturation is 96% on room air.  Past medical history hypertension anxiety and high cholesterol.       Prior to Admission medications   Medication Sig Start Date End Date Taking? Authorizing Provider  cholecalciferol (VITAMIN D) 1000 units tablet Take 4,000 Units by mouth daily.     [provider]  clonazePAM (KLONOPIN) 0.5 MG tablet Take 0.5 mg by mouth 2 (two) times daily as needed for anxiety.    [provider]  HYDROcodone -acetaminophen  (NORCO) 5-325 MG tablet Take 1 tablet by mouth every 6 (six) hours as needed. 06/10/20   Murrell Kuba, MD  lisinopril (PRINIVIL,ZESTRIL) 10 MG tablet Take 10 mg by mouth daily.    [provider]  Multiple Vitamin (MULTIVITAMIN WITH MINERALS) TABS tablet Take 1 tablet by mouth daily.    [provider]  simvastatin (ZOCOR) 20 MG tablet Take 20 mg by mouth daily.    [provider]  valACYclovir (VALTREX) 1000 MG tablet Take 1,000 mg by mouth 2 (two) times daily.    [provider]    Allergies: Lipitor [atorvastatin] and Sulfa antibiotics    Review of Systems  Constitutional:  Positive for fever. Negative for chills.  HENT:  Negative for ear pain and sore throat.   Eyes:  Negative for pain and visual disturbance.  Respiratory:  Negative for cough and shortness of breath.   Cardiovascular:  Negative for chest pain  and palpitations.  Gastrointestinal:  Positive for abdominal pain and diarrhea. Negative for vomiting.  Genitourinary:  Negative for dysuria and hematuria.  Musculoskeletal:  Negative for arthralgias and back pain.  Skin:  Negative for color change and rash.  Neurological:  Negative for seizures and syncope.  All other systems reviewed and are negative.   Updated Vital Signs BP (!) 141/86   Pulse 94   Temp 99.1 F (37.3 C) (Temporal)   Resp 15   Ht 1.549 m (5' 1)   Wt 59 kg   SpO2 100%   BMI 24.56 kg/m   Physical Exam Vitals and nursing note reviewed.  Constitutional:      General: He is not in acute distress.    Appearance: Normal appearance. He is well-developed.  HENT:     Head: Normocephalic and atraumatic.     Mouth/Throat:     Mouth: Mucous membranes are moist.  Eyes:     Extraocular Movements: Extraocular movements intact.     Conjunctiva/sclera: Conjunctivae normal.     Pupils: Pupils are equal, round, and reactive to light.  Cardiovascular:     Rate and Rhythm: Normal rate and regular rhythm.     Heart sounds: No murmur heard. Pulmonary:     Effort: Pulmonary effort is normal. No respiratory distress.     Breath sounds: Normal breath sounds.  Abdominal:     Palpations: Abdomen is soft.     Tenderness: There is abdominal tenderness.  There is guarding.     Comments: Tenderness to palpation left lower quadrant with some guarding.  Musculoskeletal:        General: No swelling.     Cervical back: Normal range of motion and neck supple.  Skin:    General: Skin is warm and dry.     Capillary Refill: Capillary refill takes less than 2 seconds.  Neurological:     General: No focal deficit present.     Mental Status: He is alert and oriented to person, place, and time.  Psychiatric:        Mood and Affect: Mood normal.     (all labs ordered are listed, but only abnormal results are displayed) Labs Reviewed  COMPREHENSIVE METABOLIC PANEL WITH GFR - Abnormal;  Notable for the following components:      Result Value   Glucose, Bld 100 (*)    All other components within normal limits  URINALYSIS, ROUTINE W REFLEX MICROSCOPIC - Abnormal; Notable for the following components:   Ketones, ur TRACE (*)    All other components within normal limits  CBC WITH DIFFERENTIAL/PLATELET - Abnormal; Notable for the following components:   WBC 11.3 (*)    RBC 3.75 (*)    Hemoglobin 12.6 (*)    HCT 36.1 (*)    Neutro Abs 8.1 (*)    Monocytes Absolute 1.1 (*)    All other components within normal limits  LIPASE, BLOOD    EKG: None  Radiology: CT ABDOMEN PELVIS W CONTRAST Result Date: 05/27/2024 EXAM: CT ABDOMEN AND PELVIS WITH CONTRAST 05/27/2024 04:38:38 PM TECHNIQUE: CT of the abdomen and pelvis was performed with the administration of 100 mL of iohexol  (OMNIPAQUE ) 300 MG/ML solution. Multiplanar reformatted images are provided for review. Automated exposure control, iterative reconstruction, and/or weight-based adjustment of the mA/kV was utilized to reduce the radiation dose to as low as reasonably achievable. COMPARISON: None available. CLINICAL HISTORY: LLQ abdominal pain. History of aortic dissection. FINDINGS: LOWER CHEST: Lung bases are clear. LIVER: The liver is unremarkable. GALLBLADDER AND BILE DUCTS: Gallbladder is unremarkable. No biliary ductal dilatation. SPLEEN: No acute abnormality. PANCREAS: No acute abnormality. ADRENAL GLANDS: No acute abnormality. KIDNEYS, URETERS AND BLADDER: No stones in the kidneys or ureters. No hydronephrosis. No perinephric or periureteral stranding. Urinary bladder is unremarkable. GI AND BOWEL: Stomach, small bowel, and colon are not abnormally distended. Sigmoid colonic diverticulosis. There is a focal area of wall thickening with pericolonic stranding in the mid to low descending colon. This likely represents an area of acute diverticulitis, although an underlying mass lesion is not excluded, and follow-up after  resolution of the acute process is recommended to exclude underlying colon cancer. The appendix is normal. No abscess. PERITONEUM AND RETROPERITONEUM: No ascites. No free air. VASCULATURE: Aorta is normal in caliber. Calcification of the aorta. No aneurysm or dissection identified. LYMPH NODES: No lymphadenopathy. REPRODUCTIVE ORGANS: Prostate gland is unremarkable. BONES AND SOFT TISSUES: Degenerative changes in the spine. Fusion of L1 through L3 vertebrae, which appear congenital. No acute osseous abnormality. No focal soft tissue abnormality. IMPRESSION: 1. Focal wall thickening with pericolonic stranding in the mid to low descending colon, likely representing acute diverticulitis; no abscess. Recommend follow-up after resolution to exclude an underlying colon mass. 2. Sigmoid colonic diverticulosis. Electronically signed by: Elsie Gravely MD 05/27/2024 04:47 PM EST RP Workstation: [id number]     Procedures   Medications Ordered in the ED  iohexol  (OMNIPAQUE ) 300 MG/ML solution 100 mL (100 mLs Intravenous Contrast Given 05/27/24  [id number])                                    Medical Decision Making Amount and/or Complexity of Data Reviewed Labs: ordered. Radiology: ordered.  Risk Prescription drug management.   Patient with tenderness left lower quadrant.  Is been present since Tuesday.  Patient without a past history of diverticulitis.  Patient nontoxic no acute distress.  Will get labs and CT scan abdomen and pelvis.  Urinalysis negative CBC white count 11.3 hemoglobin 12.6.  Platelets are normal.  Lipase normal.  Complete metabolic panel renal functions normal LFTs are normal electrolytes normal blood sugar is 100.  CT scan abdomen and pelvis focal wall thickening of the pericolonic stranding in the mid to low descending colon likely residing acute diverticulitis no abscesses no complications.  Patient clinically nontoxic should be stable for outpatient oral antibiotic  treatment.   Final diagnoses:  Left lower quadrant abdominal pain  Diverticulitis    ED Discharge Orders     None          Geraldene Hamilton, MD 05/27/24 1528    Geraldene Hamilton, MD 05/27/24 [phone]

## 2024-05-27 NOTE — ED Triage Notes (Signed)
 Pt reports LLQ abd pain x5 days. Pt also reports some diarrhea.

## 2024-05-27 NOTE — Discharge Instructions (Addendum)
 Take the Augmentin  as directed starting tomorrow.  First dose given here tonight.  Make an appointment follow-up with your doctor.  CT scan did confirm diverticulitis as we suspected.  Would expect improvement over the next few days.  Return for any new or worse symptoms persistent vomiting or fevers or worse abdominal pain.  I would recommend bland diet for the next 2 days or just clear liquids.

## 2024-05-29 DIAGNOSIS — Z800 Family history of malignant neoplasm of digestive organs: Secondary | ICD-10-CM | POA: Diagnosis not present

## 2024-05-29 DIAGNOSIS — Z860101 Personal history of adenomatous and serrated colon polyps: Secondary | ICD-10-CM | POA: Diagnosis not present

## 2024-05-29 DIAGNOSIS — K5792 Diverticulitis of intestine, part unspecified, without perforation or abscess without bleeding: Secondary | ICD-10-CM | POA: Diagnosis not present
